# Patient Record
Sex: Male | Born: 1960 | Race: White | Hispanic: No | Marital: Single
Health system: Southern US, Academic
[De-identification: ages and names within clinical notes are randomized; demographics above are authoritative.]

## PROBLEM LIST (undated history)

## (undated) ENCOUNTER — Encounter

## (undated) ENCOUNTER — Encounter
Attending: Student in an Organized Health Care Education/Training Program | Primary: Student in an Organized Health Care Education/Training Program

## (undated) ENCOUNTER — Ambulatory Visit: Payer: MEDICARE

## (undated) ENCOUNTER — Encounter: Attending: Hematology & Oncology | Primary: Hematology & Oncology

## (undated) ENCOUNTER — Ambulatory Visit

## (undated) ENCOUNTER — Encounter: Attending: Psychiatry | Primary: Psychiatry

## (undated) ENCOUNTER — Ambulatory Visit: Attending: Radiation Oncology | Primary: Radiation Oncology

## (undated) ENCOUNTER — Telehealth: Attending: Hematology & Oncology | Primary: Hematology & Oncology

## (undated) ENCOUNTER — Ambulatory Visit: Attending: Clinical | Primary: Clinical

## (undated) ENCOUNTER — Telehealth: Attending: Clinical | Primary: Clinical

## (undated) ENCOUNTER — Non-Acute Institutional Stay: Payer: MEDICARE

## (undated) ENCOUNTER — Ambulatory Visit: Attending: Infectious Disease | Primary: Infectious Disease

## (undated) ENCOUNTER — Telehealth
Attending: Student in an Organized Health Care Education/Training Program | Primary: Student in an Organized Health Care Education/Training Program

## (undated) ENCOUNTER — Encounter: Payer: MEDICARE | Attending: Radiation Oncology | Primary: Radiation Oncology

## (undated) ENCOUNTER — Telehealth

## (undated) ENCOUNTER — Telehealth: Attending: Psychiatry | Primary: Psychiatry

## (undated) DIAGNOSIS — F32A Depression, unspecified: Secondary | ICD-10-CM

## (undated) DIAGNOSIS — Z21 Asymptomatic human immunodeficiency virus [HIV] infection status: Secondary | ICD-10-CM

## (undated) DIAGNOSIS — G629 Polyneuropathy, unspecified: Secondary | ICD-10-CM

## (undated) DIAGNOSIS — B20 Human immunodeficiency virus [HIV] disease: Secondary | ICD-10-CM

## (undated) DIAGNOSIS — K219 Gastro-esophageal reflux disease without esophagitis: Secondary | ICD-10-CM

## (undated) DIAGNOSIS — C8590 Non-Hodgkin lymphoma, unspecified, unspecified site: Secondary | ICD-10-CM

## (undated) DIAGNOSIS — D013 Carcinoma in situ of anus and anal canal: Secondary | ICD-10-CM

## (undated) DIAGNOSIS — F329 Major depressive disorder, single episode, unspecified: Secondary | ICD-10-CM

## (undated) DIAGNOSIS — R911 Solitary pulmonary nodule: Secondary | ICD-10-CM

## (undated) DIAGNOSIS — B59 Pneumocystosis: Secondary | ICD-10-CM

## (undated) DIAGNOSIS — B459 Cryptococcosis, unspecified: Secondary | ICD-10-CM

## (undated) DIAGNOSIS — F419 Anxiety disorder, unspecified: Secondary | ICD-10-CM

## (undated) DIAGNOSIS — E785 Hyperlipidemia, unspecified: Secondary | ICD-10-CM

## (undated) HISTORY — DX: Non-Hodgkin lymphoma, unspecified, unspecified site: C85.90

## (undated) HISTORY — PX: APPENDECTOMY: SHX54

## (undated) HISTORY — DX: Human immunodeficiency virus (HIV) disease: B20

## (undated) HISTORY — PX: LUMBAR DISC SURGERY: SHX700

## (undated) HISTORY — PX: OTHER SURGICAL HISTORY: SHX169

## (undated) HISTORY — DX: Solitary pulmonary nodule: R91.1

---
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
## 2004-06-25 ENCOUNTER — Ambulatory Visit: Payer: Self-pay | Admitting: Pain Medicine

## 2004-07-23 ENCOUNTER — Ambulatory Visit: Payer: Self-pay | Admitting: Pain Medicine

## 2004-08-18 ENCOUNTER — Ambulatory Visit: Payer: Self-pay | Admitting: Pain Medicine

## 2004-08-28 ENCOUNTER — Ambulatory Visit: Payer: Self-pay | Admitting: Otolaryngology

## 2004-08-29 ENCOUNTER — Inpatient Hospital Stay: Payer: Self-pay | Admitting: Otolaryngology

## 2004-09-09 ENCOUNTER — Ambulatory Visit: Payer: Self-pay | Admitting: Otolaryngology

## 2004-09-11 ENCOUNTER — Inpatient Hospital Stay: Payer: Self-pay | Admitting: Otolaryngology

## 2004-10-22 ENCOUNTER — Ambulatory Visit: Payer: Self-pay | Admitting: Internal Medicine

## 2004-11-12 ENCOUNTER — Other Ambulatory Visit: Payer: Self-pay

## 2004-11-12 ENCOUNTER — Inpatient Hospital Stay: Payer: Self-pay | Admitting: Internal Medicine

## 2004-11-18 ENCOUNTER — Ambulatory Visit: Payer: Self-pay | Admitting: Internal Medicine

## 2004-12-12 ENCOUNTER — Ambulatory Visit: Payer: Self-pay | Admitting: Internal Medicine

## 2005-01-11 ENCOUNTER — Ambulatory Visit: Payer: Self-pay | Admitting: Internal Medicine

## 2005-02-11 ENCOUNTER — Inpatient Hospital Stay: Payer: Self-pay | Admitting: Internal Medicine

## 2005-02-16 ENCOUNTER — Ambulatory Visit: Payer: Self-pay | Admitting: Internal Medicine

## 2005-03-13 ENCOUNTER — Ambulatory Visit: Payer: Self-pay | Admitting: Internal Medicine

## 2005-04-13 ENCOUNTER — Ambulatory Visit: Payer: Self-pay | Admitting: Internal Medicine

## 2005-05-14 ENCOUNTER — Ambulatory Visit: Payer: Self-pay | Admitting: Internal Medicine

## 2005-06-25 ENCOUNTER — Ambulatory Visit: Payer: Self-pay | Admitting: Internal Medicine

## 2005-07-14 ENCOUNTER — Ambulatory Visit: Payer: Self-pay | Admitting: Internal Medicine

## 2005-08-13 ENCOUNTER — Ambulatory Visit: Payer: Self-pay | Admitting: Internal Medicine

## 2005-09-15 ENCOUNTER — Ambulatory Visit: Payer: Self-pay | Admitting: Specialist

## 2005-09-24 ENCOUNTER — Ambulatory Visit: Payer: Self-pay | Admitting: Otolaryngology

## 2005-10-01 ENCOUNTER — Ambulatory Visit: Payer: Self-pay | Admitting: Internal Medicine

## 2005-10-05 ENCOUNTER — Ambulatory Visit: Payer: Self-pay | Admitting: Unknown Physician Specialty

## 2005-10-14 ENCOUNTER — Ambulatory Visit: Payer: Self-pay | Admitting: Internal Medicine

## 2005-11-11 ENCOUNTER — Ambulatory Visit: Payer: Self-pay | Admitting: Internal Medicine

## 2005-12-12 ENCOUNTER — Ambulatory Visit: Payer: Self-pay | Admitting: Internal Medicine

## 2006-01-11 ENCOUNTER — Ambulatory Visit: Payer: Self-pay | Admitting: Internal Medicine

## 2006-03-01 ENCOUNTER — Ambulatory Visit: Payer: Self-pay | Admitting: Internal Medicine

## 2006-03-09 ENCOUNTER — Ambulatory Visit: Payer: Self-pay | Admitting: Specialist

## 2006-03-13 ENCOUNTER — Ambulatory Visit: Payer: Self-pay | Admitting: Internal Medicine

## 2006-04-13 ENCOUNTER — Ambulatory Visit: Payer: Self-pay | Admitting: Internal Medicine

## 2006-05-14 ENCOUNTER — Ambulatory Visit: Payer: Self-pay | Admitting: Internal Medicine

## 2006-06-13 ENCOUNTER — Inpatient Hospital Stay: Payer: Self-pay | Admitting: Specialist

## 2006-06-13 ENCOUNTER — Other Ambulatory Visit: Payer: Self-pay

## 2006-06-14 ENCOUNTER — Other Ambulatory Visit: Payer: Self-pay

## 2006-08-01 ENCOUNTER — Ambulatory Visit: Payer: Self-pay | Admitting: Internal Medicine

## 2006-08-13 ENCOUNTER — Ambulatory Visit: Payer: Self-pay | Admitting: Internal Medicine

## 2006-10-13 ENCOUNTER — Ambulatory Visit: Payer: Self-pay | Admitting: Specialist

## 2006-10-13 ENCOUNTER — Ambulatory Visit: Payer: Self-pay | Admitting: Internal Medicine

## 2006-10-27 ENCOUNTER — Ambulatory Visit: Payer: Self-pay | Admitting: Internal Medicine

## 2006-11-12 ENCOUNTER — Ambulatory Visit: Payer: Self-pay | Admitting: Internal Medicine

## 2006-12-13 ENCOUNTER — Ambulatory Visit: Payer: Self-pay | Admitting: Internal Medicine

## 2007-01-23 ENCOUNTER — Ambulatory Visit: Payer: Self-pay | Admitting: Internal Medicine

## 2007-01-25 ENCOUNTER — Ambulatory Visit: Payer: Self-pay | Admitting: Internal Medicine

## 2007-02-12 ENCOUNTER — Ambulatory Visit: Payer: Self-pay | Admitting: Internal Medicine

## 2007-03-14 ENCOUNTER — Ambulatory Visit: Payer: Self-pay | Admitting: Internal Medicine

## 2007-04-14 ENCOUNTER — Ambulatory Visit: Payer: Self-pay | Admitting: Internal Medicine

## 2007-04-26 ENCOUNTER — Ambulatory Visit: Payer: Self-pay | Admitting: Internal Medicine

## 2007-05-15 ENCOUNTER — Ambulatory Visit: Payer: Self-pay | Admitting: Internal Medicine

## 2007-06-14 ENCOUNTER — Ambulatory Visit: Payer: Self-pay | Admitting: Internal Medicine

## 2007-07-15 ENCOUNTER — Ambulatory Visit: Payer: Self-pay | Admitting: Internal Medicine

## 2007-08-06 ENCOUNTER — Emergency Department: Payer: Self-pay | Admitting: Emergency Medicine

## 2007-08-14 ENCOUNTER — Ambulatory Visit: Payer: Self-pay | Admitting: Internal Medicine

## 2007-09-14 ENCOUNTER — Ambulatory Visit: Payer: Self-pay | Admitting: Internal Medicine

## 2007-10-03 ENCOUNTER — Ambulatory Visit: Payer: Self-pay | Admitting: Internal Medicine

## 2007-10-15 ENCOUNTER — Ambulatory Visit: Payer: Self-pay | Admitting: Internal Medicine

## 2007-11-12 ENCOUNTER — Ambulatory Visit: Payer: Self-pay | Admitting: Internal Medicine

## 2007-12-13 ENCOUNTER — Ambulatory Visit: Payer: Self-pay | Admitting: Internal Medicine

## 2008-01-12 ENCOUNTER — Ambulatory Visit: Payer: Self-pay | Admitting: Internal Medicine

## 2008-02-12 ENCOUNTER — Ambulatory Visit: Payer: Self-pay | Admitting: Internal Medicine

## 2008-02-21 ENCOUNTER — Ambulatory Visit: Payer: Self-pay | Admitting: Internal Medicine

## 2008-03-13 ENCOUNTER — Ambulatory Visit: Payer: Self-pay | Admitting: Internal Medicine

## 2008-05-14 ENCOUNTER — Ambulatory Visit: Payer: Self-pay | Admitting: Internal Medicine

## 2008-05-21 ENCOUNTER — Ambulatory Visit: Payer: Self-pay | Admitting: Internal Medicine

## 2008-06-11 ENCOUNTER — Ambulatory Visit: Payer: Self-pay | Admitting: Specialist

## 2008-06-13 ENCOUNTER — Ambulatory Visit: Payer: Self-pay | Admitting: Internal Medicine

## 2008-07-14 ENCOUNTER — Ambulatory Visit: Payer: Self-pay | Admitting: Internal Medicine

## 2008-08-14 ENCOUNTER — Ambulatory Visit: Payer: Self-pay | Admitting: Internal Medicine

## 2008-09-13 ENCOUNTER — Ambulatory Visit: Payer: Self-pay | Admitting: Internal Medicine

## 2008-10-14 ENCOUNTER — Ambulatory Visit: Payer: Self-pay | Admitting: Internal Medicine

## 2008-10-27 IMAGING — CR DG CHEST 2V
1 series · 2 of 2 positions shown · non-contrast
Comparison: none

REASON FOR EXAM: COUGH
COMMENTS:

[Series 1: view not recorded · 0.17mm/px · 2 of 2 slices shown]
[im 1/2]
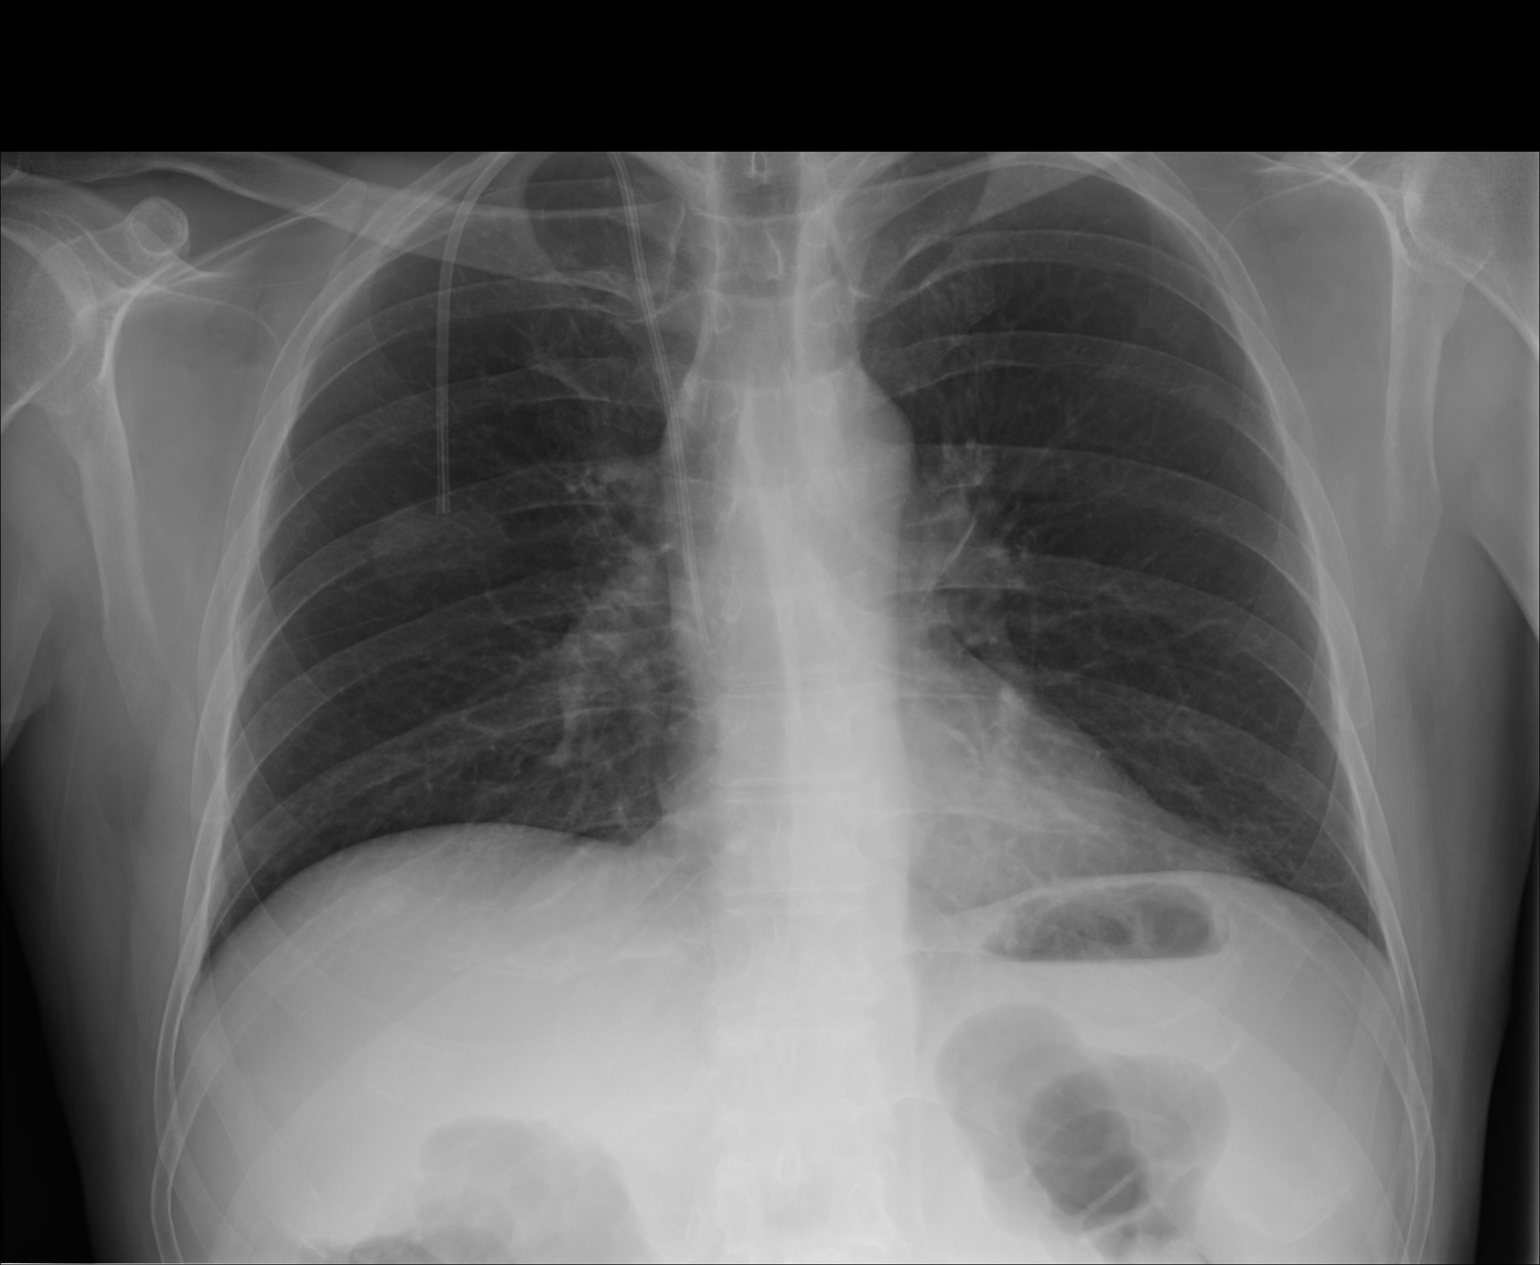
[im 2/2]
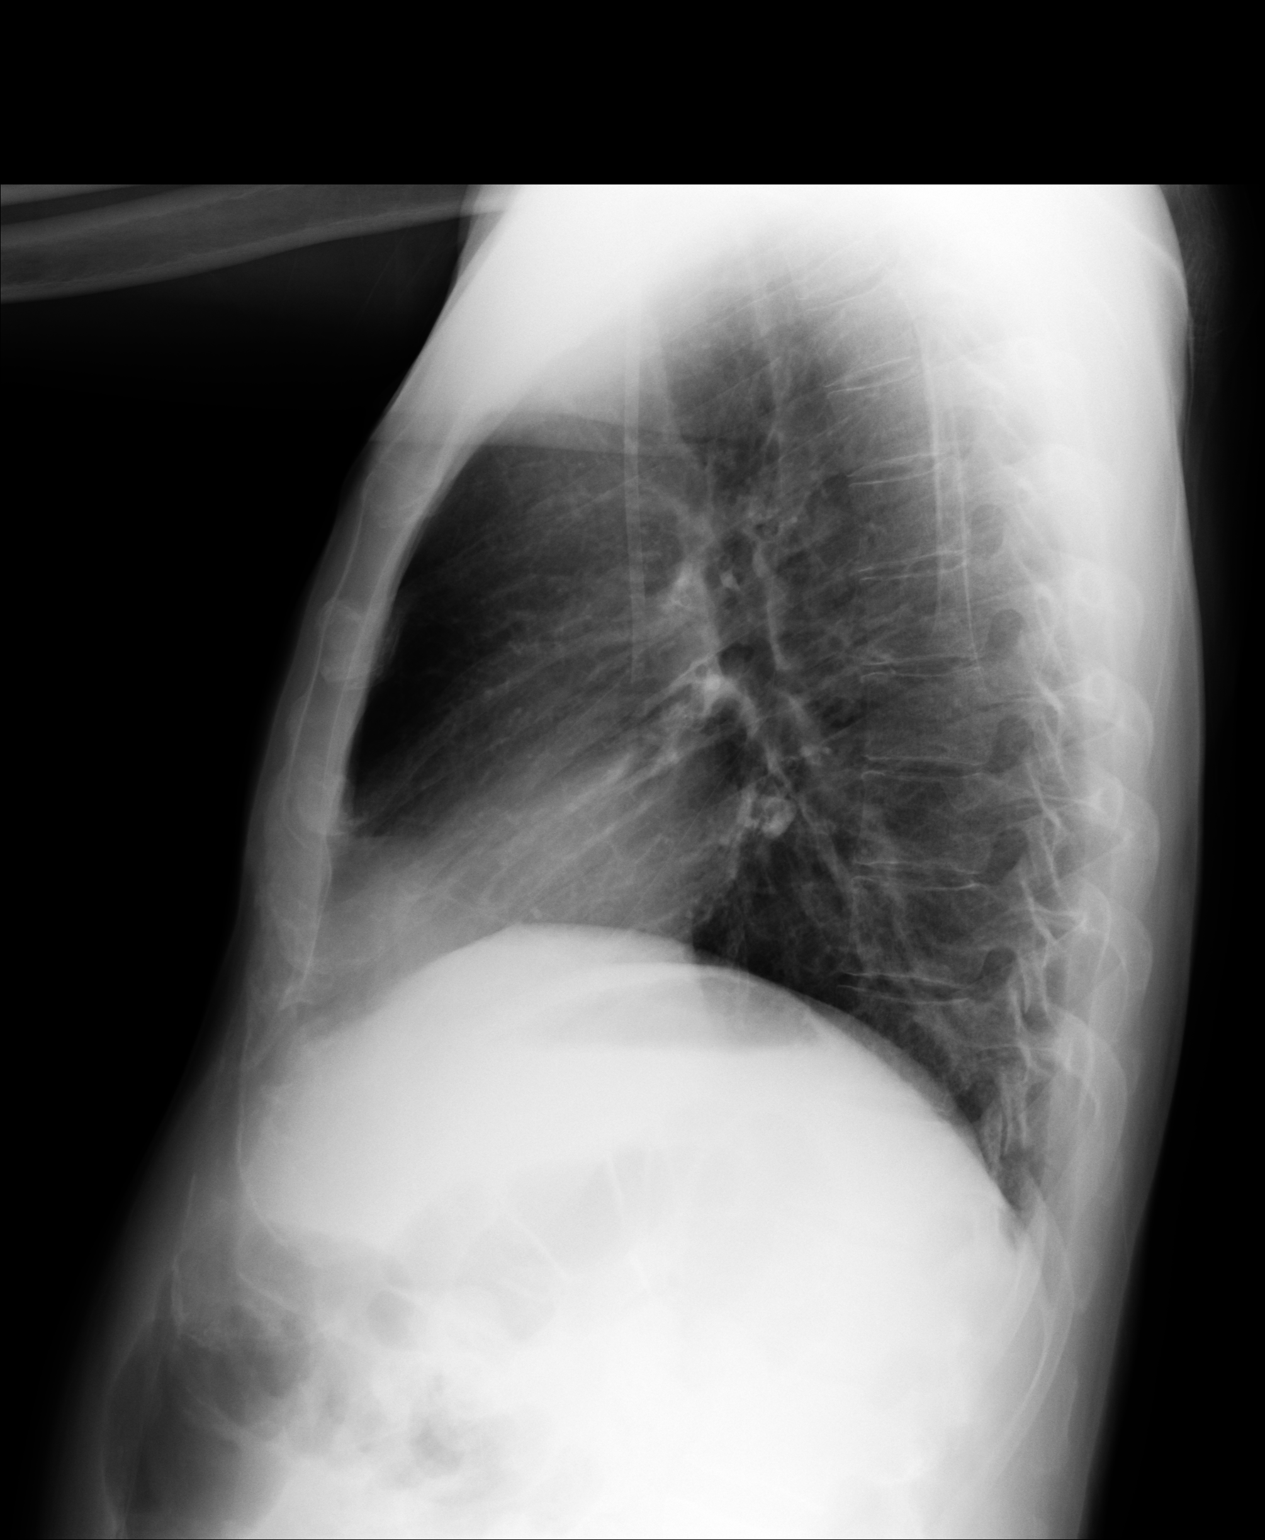

[2 of 2 positions shown; findings below may reference images not displayed]

PROCEDURE:     DXR - DXR CHEST PA (OR AP) AND LATERAL  - June 11, 2008 [DATE]

RESULT:     Comparison is made to the exam of 06/13/2006. A RIGHT-sided
Port-A-Cath device remains present. The heart is normal in size. The lungs
are clear. The lung markings are slightly coarse. There is no effusion or
definite mass.
IMPRESSION: No acute cardiopulmonary disease present. Stable appearance. Port-A-Cath
device present with the tip of the catheter in the superior vena cava near
the RIGHT atrium region.

## 2008-10-31 ENCOUNTER — Emergency Department: Payer: Self-pay | Admitting: Emergency Medicine

## 2008-11-07 ENCOUNTER — Ambulatory Visit: Payer: Self-pay | Admitting: Specialist

## 2008-11-11 ENCOUNTER — Ambulatory Visit: Payer: Self-pay | Admitting: Internal Medicine

## 2008-11-19 IMAGING — CT CT NECK-CHEST-ABD-PELV W/ CM
1 of 2 series · 9 of 14 positions shown, 11 images · non-contrast
Comparison: none

REASON FOR EXAM: Lymphoma follow-up
COMMENTS:

[Series 2: soft tissue · axial · 0.76mm/px · z∈[+18,+702]mm · 9 of 173 slices shown, 11 images]
[im 18/173  soft-tissue]
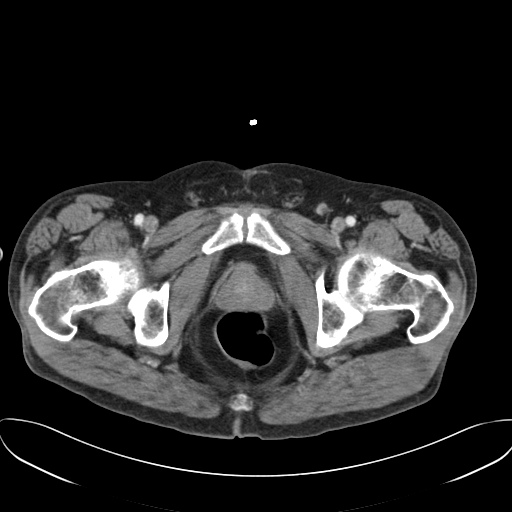
[im 18/173  bone]
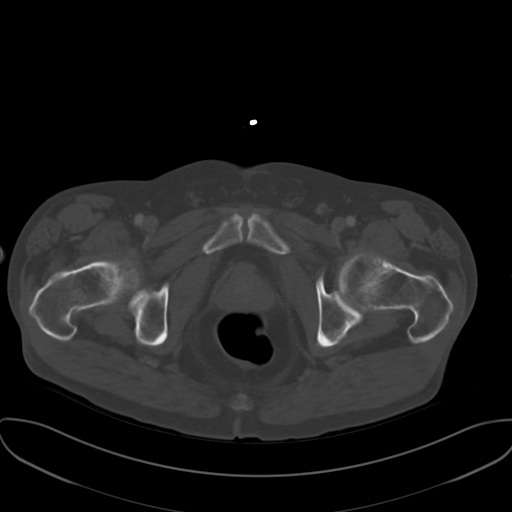
[im 35/173  soft-tissue]
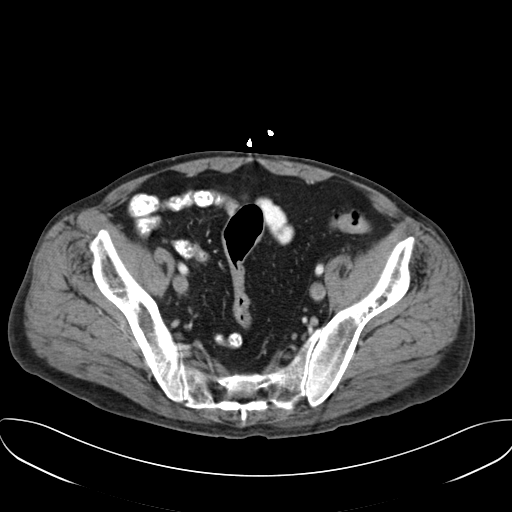
[im 52/173  soft-tissue]
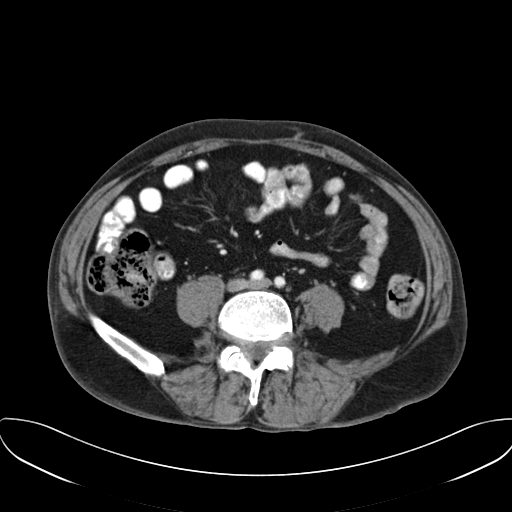
[im 69/173  soft-tissue]
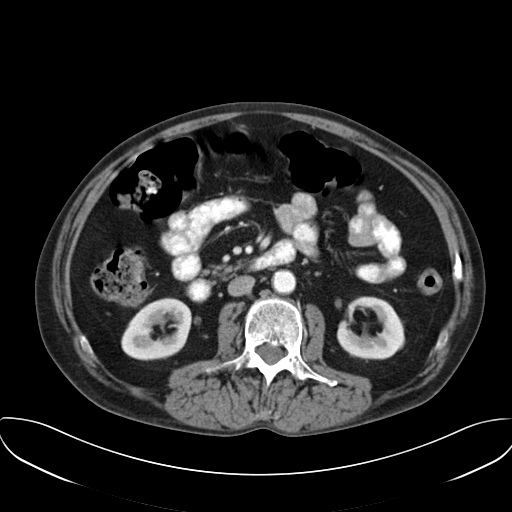
[im 87/173  soft-tissue]
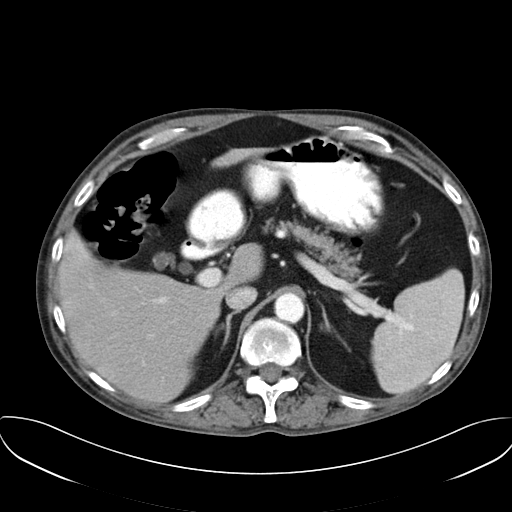
[im 104/173  soft-tissue]
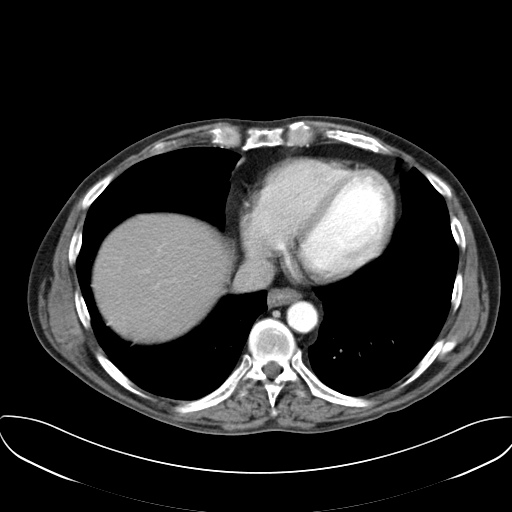
[im 121/173  soft-tissue]
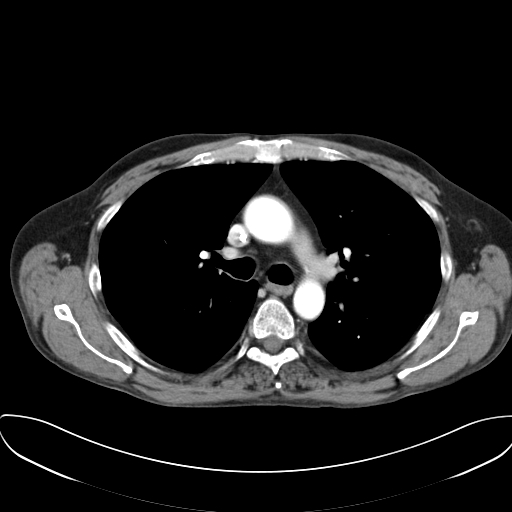
[im 138/173  soft-tissue]
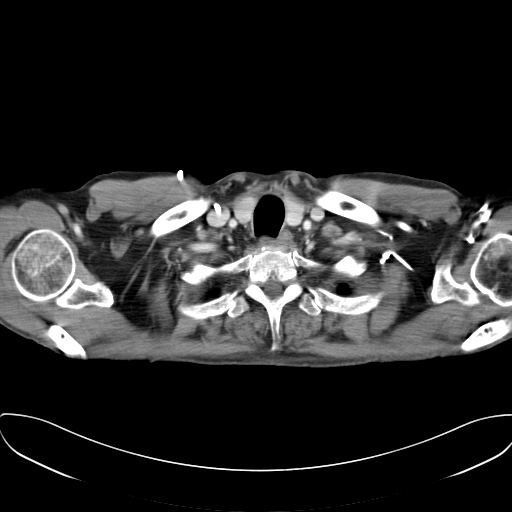
[im 155/173  soft-tissue]
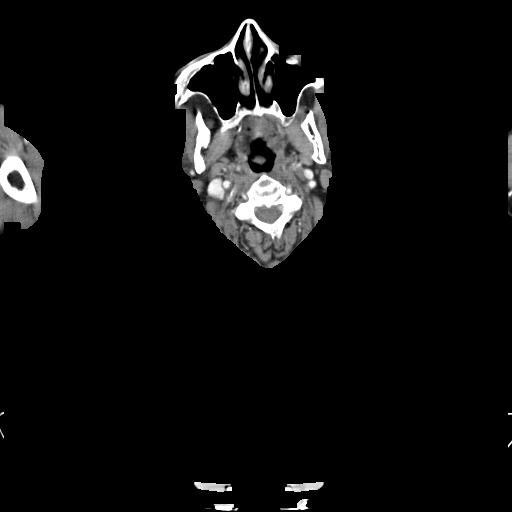
[im 155/173  bone]
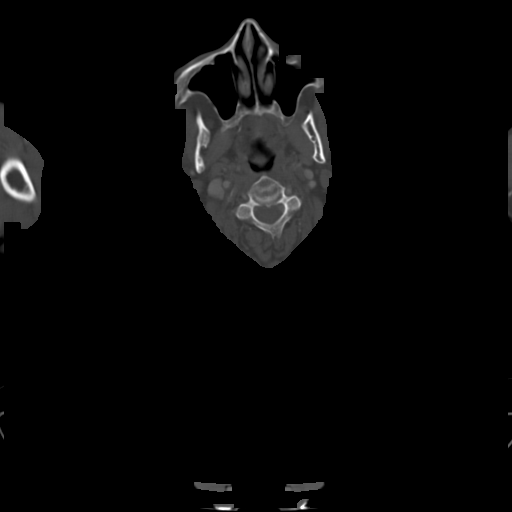

[9 of 14 positions shown; findings below may reference images not displayed]

PROCEDURE:     CT  - CT NECK CHEST ABD/PEL W  - July 04, 2008  [DATE]

RESULT:     CT of the neck, chest abdomen and pelvis is performed utilizing
oral contrast and approximately 85 ml of Isovue-XJB iodinated intravenous
contrast.

Comparison is made to the most recent CT of similar regions performed on
03/09/2006.
FINDINGS: Cervical lymph nodes seen posterior to the jugular vein on the
RIGHT are smaller than on the previous scan and similar in appearance to
that on the previous PET/CT of 10/03/2007. At that point, there was
continued abnormal localization within these nodes. PET follow-up is
recommended. No enlarging cervical lymph nodes are present. No neck mass is
evident. There is an air/fluid level in the RIGHT sphenoid sinus which is
nonspecific but could be associated with sphenoid sinusitis. There is no
evidence of abnormal nodal tissue in the mediastinum with no enlarged nodes
present. No hilar mass or adenopathy is demonstrated. The study demonstrates
no evidence of axillary mass or adenopathy. Images through the lungs
demonstrate no focal pulmonary parenchymal mass. There is no infiltrate,
edema or effusion.

Images through the abdomen and pelvis demonstrate an unremarkable appearance
of the spleen, liver, gallbladder and pancreas with the exception of mild
prominence of the intrahepatic biliary system. There is a tiny area of low
attenuation in the liver measuring 7.0 mm on image #82 adjacent to the porta
hepatis which is unchanged since the study two years ago. This is
nonspecific but could represent a small cyst or hemangioma. Correlation with
LFT's is recommended given the mild intrahepatic biliary ductal dilation.
The common bile duct does not appear to be distended. There is no evidence
of adenopathy within the porta hepatis or retroperitoneum. No mesenteric or
pelvic mass or adenopathy is present. No inguinal mass or adenopathy is
evident. The urinary bladder is unremarkable. The bowel appears to be within
normal limits. No inflammatory changes are present. There is no free air or
free fluid.
IMPRESSION: 1.  Stable lymph nodes in the RIGHT neck posterior to the jugular. These are
nonenlarged and were positive on the previous PET. PET follow-up could be
considered.
2.  Mild intrahepatic biliary ductal dilation. Correlation with LFT's is
recommended. The common bile duct does not appear to be abnormally distended
by CT.
3.  No other significant abnormality.

## 2008-12-20 ENCOUNTER — Ambulatory Visit: Payer: Self-pay | Admitting: Specialist

## 2008-12-25 ENCOUNTER — Ambulatory Visit: Payer: Self-pay | Admitting: Internal Medicine

## 2009-01-11 ENCOUNTER — Ambulatory Visit: Payer: Self-pay | Admitting: Internal Medicine

## 2009-02-11 ENCOUNTER — Ambulatory Visit: Payer: Self-pay | Admitting: Internal Medicine

## 2009-04-22 ENCOUNTER — Ambulatory Visit: Payer: Self-pay | Admitting: Internal Medicine

## 2009-05-14 ENCOUNTER — Ambulatory Visit: Payer: Self-pay | Admitting: Internal Medicine

## 2009-06-13 ENCOUNTER — Ambulatory Visit: Payer: Self-pay | Admitting: Internal Medicine

## 2009-10-14 ENCOUNTER — Ambulatory Visit: Payer: Self-pay | Admitting: Internal Medicine

## 2009-10-16 ENCOUNTER — Ambulatory Visit: Payer: Self-pay | Admitting: Internal Medicine

## 2009-11-11 ENCOUNTER — Ambulatory Visit: Payer: Self-pay | Admitting: Internal Medicine

## 2009-12-12 ENCOUNTER — Ambulatory Visit: Payer: Self-pay | Admitting: Internal Medicine

## 2009-12-19 ENCOUNTER — Inpatient Hospital Stay: Payer: Self-pay | Admitting: Specialist

## 2010-02-23 ENCOUNTER — Ambulatory Visit: Payer: Self-pay | Admitting: Internal Medicine

## 2010-03-13 ENCOUNTER — Ambulatory Visit: Payer: Self-pay | Admitting: Internal Medicine

## 2010-03-30 ENCOUNTER — Inpatient Hospital Stay: Payer: Self-pay | Admitting: Specialist

## 2010-04-13 ENCOUNTER — Ambulatory Visit: Payer: Self-pay | Admitting: Internal Medicine

## 2010-07-08 ENCOUNTER — Ambulatory Visit: Payer: Self-pay | Admitting: Internal Medicine

## 2010-07-14 ENCOUNTER — Ambulatory Visit: Payer: Self-pay | Admitting: Internal Medicine

## 2010-07-23 ENCOUNTER — Ambulatory Visit: Payer: Self-pay | Admitting: Specialist

## 2010-08-13 ENCOUNTER — Ambulatory Visit: Payer: Self-pay | Admitting: Internal Medicine

## 2010-11-11 ENCOUNTER — Ambulatory Visit: Payer: Self-pay | Admitting: Internal Medicine

## 2010-11-12 ENCOUNTER — Ambulatory Visit: Payer: Self-pay | Admitting: Internal Medicine

## 2011-01-12 DIAGNOSIS — R911 Solitary pulmonary nodule: Secondary | ICD-10-CM

## 2011-01-12 HISTORY — DX: Solitary pulmonary nodule: R91.1

## 2011-02-10 ENCOUNTER — Ambulatory Visit: Payer: Self-pay | Admitting: Internal Medicine

## 2011-02-12 ENCOUNTER — Ambulatory Visit: Payer: Self-pay | Admitting: Internal Medicine

## 2011-02-22 ENCOUNTER — Ambulatory Visit: Payer: Self-pay | Admitting: Internal Medicine

## 2011-03-03 ENCOUNTER — Ambulatory Visit: Payer: Self-pay | Admitting: Internal Medicine

## 2011-03-14 ENCOUNTER — Ambulatory Visit: Payer: Self-pay | Admitting: Internal Medicine

## 2011-04-14 ENCOUNTER — Ambulatory Visit: Payer: Self-pay | Admitting: Internal Medicine

## 2011-05-12 ENCOUNTER — Ambulatory Visit: Payer: Self-pay | Admitting: Specialist

## 2011-06-18 ENCOUNTER — Ambulatory Visit: Payer: Self-pay | Admitting: Internal Medicine

## 2011-07-15 ENCOUNTER — Ambulatory Visit: Payer: Self-pay | Admitting: Internal Medicine

## 2011-07-22 ENCOUNTER — Other Ambulatory Visit: Payer: Self-pay | Admitting: Podiatry

## 2011-08-14 ENCOUNTER — Ambulatory Visit: Payer: Self-pay | Admitting: Internal Medicine

## 2012-02-16 ENCOUNTER — Ambulatory Visit: Payer: Self-pay | Admitting: Specialist

## 2012-03-07 ENCOUNTER — Ambulatory Visit: Payer: Self-pay | Admitting: Internal Medicine

## 2012-03-07 LAB — CBC CANCER CENTER
Lymphocyte %: 30.7 %
MCH: 40 pg — ABNORMAL HIGH (ref 26.0–34.0)
MCHC: 32.6 g/dL (ref 32.0–36.0)
Monocyte #: 0.4 x10 3/mm (ref 0.2–1.0)
Neutrophil #: 2.5 x10 3/mm (ref 1.4–6.5)
Neutrophil %: 58.7 %
Platelet: 160 x10 3/mm (ref 150–440)
RBC: 2.82 10*6/uL — ABNORMAL LOW (ref 4.40–5.90)
RDW: 13.4 % (ref 11.5–14.5)
WBC: 4.2 x10 3/mm (ref 3.8–10.6)

## 2012-03-07 LAB — HEPATIC FUNCTION PANEL A (ARMC)
Bilirubin, Direct: 0.2 mg/dL (ref 0.00–0.20)
Bilirubin,Total: 0.9 mg/dL (ref 0.2–1.0)
SGPT (ALT): 27 U/L
Total Protein: 6.7 g/dL (ref 6.4–8.2)

## 2012-03-07 LAB — LACTATE DEHYDROGENASE: LDH: 253 U/L — ABNORMAL HIGH (ref 87–241)

## 2012-03-07 LAB — CREATININE, SERUM: Creatinine: 0.99 mg/dL (ref 0.60–1.30)

## 2012-03-13 ENCOUNTER — Ambulatory Visit: Payer: Self-pay | Admitting: Internal Medicine

## 2012-03-17 ENCOUNTER — Ambulatory Visit: Payer: Self-pay | Admitting: Internal Medicine

## 2012-05-12 ENCOUNTER — Ambulatory Visit: Payer: Self-pay | Admitting: Internal Medicine

## 2012-05-14 ENCOUNTER — Ambulatory Visit: Payer: Self-pay | Admitting: Internal Medicine

## 2012-09-22 ENCOUNTER — Ambulatory Visit: Payer: Self-pay | Admitting: Internal Medicine

## 2012-09-22 LAB — HEPATIC FUNCTION PANEL A (ARMC)
Albumin: 4 g/dL (ref 3.4–5.0)
SGOT(AST): 20 U/L (ref 15–37)
SGPT (ALT): 17 U/L (ref 12–78)
Total Protein: 7.3 g/dL (ref 6.4–8.2)

## 2012-09-22 LAB — LACTATE DEHYDROGENASE: LDH: 308 U/L — ABNORMAL HIGH (ref 85–241)

## 2012-09-22 LAB — CBC CANCER CENTER
Basophil #: 0 x10 3/mm (ref 0.0–0.1)
Eosinophil #: 0.1 x10 3/mm (ref 0.0–0.7)
Eosinophil %: 1.1 %
HCT: 32.4 % — ABNORMAL LOW (ref 40.0–52.0)
HGB: 10.7 g/dL — ABNORMAL LOW (ref 13.0–18.0)
Lymphocyte #: 1.5 x10 3/mm (ref 1.0–3.6)
MCH: 38.6 pg — ABNORMAL HIGH (ref 26.0–34.0)
Neutrophil #: 3.4 x10 3/mm (ref 1.4–6.5)
Neutrophil %: 61.4 %
RDW: 16.7 % — ABNORMAL HIGH (ref 11.5–14.5)
WBC: 5.5 x10 3/mm (ref 3.8–10.6)

## 2012-09-22 LAB — CREATININE, SERUM: EGFR (Non-African Amer.): >60

## 2012-10-13 ENCOUNTER — Ambulatory Visit: Payer: Self-pay | Admitting: Vascular Surgery

## 2012-10-14 ENCOUNTER — Ambulatory Visit: Payer: Self-pay | Admitting: Internal Medicine

## 2013-03-19 ENCOUNTER — Ambulatory Visit: Payer: Self-pay | Admitting: Internal Medicine

## 2013-03-20 LAB — CBC CANCER CENTER
Basophil #: 0 x10 3/mm (ref 0.0–0.1)
Basophil %: 0.6 %
Eosinophil #: 0.1 x10 3/mm (ref 0.0–0.7)
HCT: 35.8 % — ABNORMAL LOW (ref 40.0–52.0)
Lymphocyte %: 40.1 %
MCH: 38 pg — ABNORMAL HIGH (ref 26.0–34.0)
MCHC: 35.2 g/dL (ref 32.0–36.0)
Monocyte #: 0.3 x10 3/mm (ref 0.2–1.0)
Monocyte %: 7.4 %
Neutrophil #: 2.2 x10 3/mm (ref 1.4–6.5)
Neutrophil %: 50.8 %
Platelet: 151 x10 3/mm (ref 150–440)

## 2013-03-20 LAB — CREATININE, SERUM: Creatinine: 1.21 mg/dL (ref 0.60–1.30)

## 2013-03-20 LAB — HEPATIC FUNCTION PANEL A (ARMC)
Bilirubin,Total: 0.5 mg/dL (ref 0.2–1.0)
SGOT(AST): 24 U/L (ref 15–37)

## 2013-03-20 LAB — LACTATE DEHYDROGENASE: LDH: 164 U/L (ref 85–241)

## 2013-04-13 ENCOUNTER — Ambulatory Visit: Payer: Self-pay | Admitting: Internal Medicine

## 2013-09-21 ENCOUNTER — Ambulatory Visit: Payer: Self-pay | Admitting: Internal Medicine

## 2013-09-21 LAB — HEPATIC FUNCTION PANEL A (ARMC)
ALBUMIN: 4.2 g/dL (ref 3.4–5.0)
ALK PHOS: 71 U/L
ALT: 27 U/L (ref 12–78)
AST: 22 U/L (ref 15–37)
BILIRUBIN DIRECT: 0.2 mg/dL (ref 0.00–0.20)
Bilirubin,Total: 0.8 mg/dL (ref 0.2–1.0)
Total Protein: 6.8 g/dL (ref 6.4–8.2)

## 2013-09-21 LAB — CBC CANCER CENTER
Basophil #: 0 x10 3/mm (ref 0.0–0.1)
Basophil %: 1.2 %
Eosinophil #: 0.1 x10 3/mm (ref 0.0–0.7)
Eosinophil %: 1.8 %
HCT: 38.2 % — AB (ref 40.0–52.0)
HGB: 12.7 g/dL — ABNORMAL LOW (ref 13.0–18.0)
Lymphocyte #: 2.1 x10 3/mm (ref 1.0–3.6)
Lymphocyte %: 51.8 %
MCH: 37.5 pg — AB (ref 26.0–34.0)
MCHC: 33.3 g/dL (ref 32.0–36.0)
MCV: 113 fL — ABNORMAL HIGH (ref 80–100)
Monocyte #: 0.3 x10 3/mm (ref 0.2–1.0)
Monocyte %: 8.6 %
NEUTROS ABS: 1.5 x10 3/mm (ref 1.4–6.5)
NEUTROS PCT: 36.6 %
Platelet: 170 x10 3/mm (ref 150–440)
RBC: 3.39 10*6/uL — AB (ref 4.40–5.90)
RDW: 13.4 % (ref 11.5–14.5)
WBC: 4 x10 3/mm (ref 3.8–10.6)

## 2013-09-21 LAB — CREATININE, SERUM
Creatinine: 1.37 mg/dL — ABNORMAL HIGH (ref 0.60–1.30)
EGFR (African American): >60
EGFR (Non-African Amer.): 59 — ABNORMAL LOW

## 2013-09-21 LAB — LACTATE DEHYDROGENASE: LDH: 161 U/L (ref 85–241)

## 2013-10-14 ENCOUNTER — Ambulatory Visit: Payer: Self-pay | Admitting: Internal Medicine

## 2014-01-03 ENCOUNTER — Ambulatory Visit: Payer: Self-pay

## 2014-01-07 ENCOUNTER — Ambulatory Visit: Payer: Self-pay | Admitting: Internal Medicine

## 2014-01-07 LAB — APTT: Activated PTT: 29.2 secs (ref 23.6–35.9)

## 2014-01-07 LAB — COMPREHENSIVE METABOLIC PANEL
Albumin: 3.8 g/dL (ref 3.4–5.0)
Alkaline Phosphatase: 97 U/L
Anion Gap: 7 (ref 7–16)
BUN: 15 mg/dL (ref 7–18)
Bilirubin,Total: 0.4 mg/dL (ref 0.2–1.0)
CO2: 30 mmol/L (ref 21–32)
CREATININE: 1.22 mg/dL (ref 0.60–1.30)
Calcium, Total: 9.3 mg/dL (ref 8.5–10.1)
Chloride: 102 mmol/L (ref 98–107)
EGFR (African American): >60
Glucose: 113 mg/dL — ABNORMAL HIGH (ref 65–99)
OSMOLALITY: 279 (ref 275–301)
Potassium: 4.4 mmol/L (ref 3.5–5.1)
SGOT(AST): 25 U/L (ref 15–37)
SGPT (ALT): 28 U/L (ref 12–78)
Sodium: 139 mmol/L (ref 136–145)
Total Protein: 6.8 g/dL (ref 6.4–8.2)

## 2014-01-07 LAB — CBC CANCER CENTER
Basophil #: 0 x10 3/mm (ref 0.0–0.1)
Basophil %: 0.8 %
EOS ABS: 0 x10 3/mm (ref 0.0–0.7)
Eosinophil %: 1.4 %
HCT: 38.6 % — AB (ref 40.0–52.0)
HGB: 12.8 g/dL — ABNORMAL LOW (ref 13.0–18.0)
LYMPHS ABS: 0.9 x10 3/mm — AB (ref 1.0–3.6)
Lymphocyte %: 32.4 %
MCH: 37.6 pg — ABNORMAL HIGH (ref 26.0–34.0)
MCHC: 33.3 g/dL (ref 32.0–36.0)
MCV: 113 fL — ABNORMAL HIGH (ref 80–100)
MONOS PCT: 7.2 %
Monocyte #: 0.2 x10 3/mm (ref 0.2–1.0)
Neutrophil #: 1.6 x10 3/mm (ref 1.4–6.5)
Neutrophil %: 58.2 %
Platelet: 140 x10 3/mm — ABNORMAL LOW (ref 150–440)
RBC: 3.42 10*6/uL — ABNORMAL LOW (ref 4.40–5.90)
RDW: 14.7 % — ABNORMAL HIGH (ref 11.5–14.5)
WBC: 2.8 x10 3/mm — AB (ref 3.8–10.6)

## 2014-01-07 LAB — PROTIME-INR
INR: 0.9
PROTHROMBIN TIME: 11.8 s (ref 11.5–14.7)

## 2014-01-11 ENCOUNTER — Ambulatory Visit: Payer: Self-pay | Admitting: Internal Medicine

## 2014-01-14 ENCOUNTER — Ambulatory Visit: Payer: Self-pay | Admitting: Internal Medicine

## 2014-07-09 ENCOUNTER — Ambulatory Visit: Payer: Self-pay | Admitting: Internal Medicine

## 2014-07-09 LAB — CREATININE, SERUM
Creatinine: 1.03 mg/dL (ref 0.60–1.30)
EGFR (Non-African Amer.): >60

## 2014-07-09 LAB — CBC CANCER CENTER
Basophil #: 0 x10 3/mm (ref 0.0–0.1)
Basophil %: 0.8 %
Eosinophil #: 0.1 x10 3/mm (ref 0.0–0.7)
Eosinophil %: 1.4 %
HCT: 39 % — ABNORMAL LOW (ref 40.0–52.0)
HGB: 12.9 g/dL — ABNORMAL LOW (ref 13.0–18.0)
Lymphocyte #: 1.6 x10 3/mm (ref 1.0–3.6)
Lymphocyte %: 38.1 %
MCH: 37 pg — ABNORMAL HIGH (ref 26.0–34.0)
MCHC: 33.2 g/dL (ref 32.0–36.0)
MCV: 111 fL — ABNORMAL HIGH (ref 80–100)
Monocyte #: 0.3 x10 3/mm (ref 0.2–1.0)
Monocyte %: 7.1 %
Neutrophil #: 2.2 x10 3/mm (ref 1.4–6.5)
Neutrophil %: 52.6 %
PLATELETS: 238 x10 3/mm (ref 150–440)
RBC: 3.5 10*6/uL — AB (ref 4.40–5.90)
RDW: 14.8 % — ABNORMAL HIGH (ref 11.5–14.5)
WBC: 4.2 x10 3/mm (ref 3.8–10.6)

## 2014-07-09 LAB — LACTATE DEHYDROGENASE: LDH: 203 U/L (ref 85–241)

## 2014-07-09 LAB — HEPATIC FUNCTION PANEL A (ARMC)
Albumin: 3.8 g/dL (ref 3.4–5.0)
Alkaline Phosphatase: 78 U/L
Bilirubin, Direct: 0.1 mg/dL (ref 0.0–0.2)
Bilirubin,Total: 0.4 mg/dL (ref 0.2–1.0)
SGOT(AST): 45 U/L — ABNORMAL HIGH (ref 15–37)
SGPT (ALT): 25 U/L
Total Protein: 7.5 g/dL (ref 6.4–8.2)

## 2014-07-14 ENCOUNTER — Ambulatory Visit: Payer: Self-pay | Admitting: Internal Medicine

## 2015-01-03 NOTE — Op Note (Signed)
PATIENT NAME:  Steven Compton, Steven Compton MR#:  [id number] DATE OF BIRTH:  08-13-1961  DATE OF PROCEDURE:  10/13/2012  PREOPERATIVE DIAGNOSIS:  Lymphoma status post chemotherapy and Port-A-Cath placement with Port-A-Cath no longer being used.  POSTOPERATIVE DIAGNOSIS:  Lymphoma status post chemotherapy and Port-A-Cath placement with Port-A-Cath no longer being used.   PROCEDURE:  Removal of a Port-A-Cath.   SURGEON:  Kinaya Hilliker  ANESTHESIA: Local with moderate conscious sedation.   BLOOD LOSS: Minimal.  INDICATION FOR PROCEDURE:  A 54 year old white male with a longstanding Port-A-Cath that has not been necessary for several years. He desires to have this removed.   DESCRIPTION OF PROCEDURE: The patient is brought to the vascular radiology suite. Right neck and chest sterilely prepped and draped. A sterile surgical field was created. The previous incision was anesthetized and the port was dissected out. It was dissected free from the surrounding scar tissue and the catheter was then gently removed in its entirety with gentle traction and the port was removed in its entirety without difficulty. Figure-of-eight Vicryl sutures were used to close the catheter site and then the incision was closed with a running 3-0 Vicryl and 4-0 Monocryl. Dermabond was placed as dressing. The patient tolerated the procedure well and was taken to the recovery room in stable condition.    ____________________________ Algernon Huxley, MD jsd:ce D: 10/13/2012 09:20:04 ET T: 10/13/2012 12:31:26 ET JOB#: [id number] Algernon Huxley MD ELECTRONICALLY SIGNED 10/14/2012 15:40

## 2015-01-30 ENCOUNTER — Other Ambulatory Visit: Payer: Self-pay | Admitting: *Deleted

## 2015-01-30 NOTE — Telephone Encounter (Signed)
Both last filled on 01/01/15 for same quantity Wants to pick up on 5//20/16

## 2015-01-31 MED ORDER — OXYCODONE HCL 10 MG PO TABS: 10.0000 mg | ORAL_TABLET | Freq: Four times a day (QID) | ORAL | Status: DC | PRN

## 2015-01-31 MED ORDER — MORPHINE SULFATE ER 15 MG PO TBCR: EXTENDED_RELEASE_TABLET | ORAL | Status: DC

## 2015-02-13 ENCOUNTER — Other Ambulatory Visit: Payer: Self-pay

## 2015-02-13 DIAGNOSIS — C8330 Diffuse large B-cell lymphoma, unspecified site: Secondary | ICD-10-CM

## 2015-02-14 ENCOUNTER — Encounter (INDEPENDENT_AMBULATORY_CARE_PROVIDER_SITE_OTHER): Payer: Self-pay

## 2015-02-14 ENCOUNTER — Inpatient Hospital Stay (HOSPITAL_BASED_OUTPATIENT_CLINIC_OR_DEPARTMENT_OTHER): Payer: Medicare Other | Admitting: Internal Medicine

## 2015-02-14 ENCOUNTER — Inpatient Hospital Stay: Payer: Medicare Other | Attending: Internal Medicine

## 2015-02-14 VITALS — BP 112/80 | HR 68 | Temp 97.6°F | Resp 18 | Ht 73.0 in | Wt 151.9 lb

## 2015-02-14 DIAGNOSIS — Z9221 Personal history of antineoplastic chemotherapy: Secondary | ICD-10-CM | POA: Diagnosis not present

## 2015-02-14 DIAGNOSIS — R531 Weakness: Secondary | ICD-10-CM | POA: Insufficient documentation

## 2015-02-14 DIAGNOSIS — Z809 Family history of malignant neoplasm, unspecified: Secondary | ICD-10-CM | POA: Insufficient documentation

## 2015-02-14 DIAGNOSIS — F419 Anxiety disorder, unspecified: Secondary | ICD-10-CM | POA: Insufficient documentation

## 2015-02-14 DIAGNOSIS — I10 Essential (primary) hypertension: Secondary | ICD-10-CM

## 2015-02-14 DIAGNOSIS — C8330 Diffuse large B-cell lymphoma, unspecified site: Secondary | ICD-10-CM | POA: Diagnosis not present

## 2015-02-14 DIAGNOSIS — K219 Gastro-esophageal reflux disease without esophagitis: Secondary | ICD-10-CM | POA: Insufficient documentation

## 2015-02-14 DIAGNOSIS — M542 Cervicalgia: Secondary | ICD-10-CM | POA: Insufficient documentation

## 2015-02-14 DIAGNOSIS — Z79899 Other long term (current) drug therapy: Secondary | ICD-10-CM

## 2015-02-14 DIAGNOSIS — D649 Anemia, unspecified: Secondary | ICD-10-CM | POA: Insufficient documentation

## 2015-02-14 DIAGNOSIS — R5383 Other fatigue: Secondary | ICD-10-CM

## 2015-02-14 DIAGNOSIS — Z87891 Personal history of nicotine dependence: Secondary | ICD-10-CM

## 2015-02-14 DIAGNOSIS — F329 Major depressive disorder, single episode, unspecified: Secondary | ICD-10-CM | POA: Insufficient documentation

## 2015-02-14 DIAGNOSIS — B20 Human immunodeficiency virus [HIV] disease: Secondary | ICD-10-CM

## 2015-02-14 DIAGNOSIS — Z923 Personal history of irradiation: Secondary | ICD-10-CM | POA: Diagnosis not present

## 2015-02-14 DIAGNOSIS — R918 Other nonspecific abnormal finding of lung field: Secondary | ICD-10-CM

## 2015-02-14 DIAGNOSIS — C8590 Non-Hodgkin lymphoma, unspecified, unspecified site: Principal | ICD-10-CM

## 2015-02-14 LAB — CBC WITH DIFFERENTIAL/PLATELET
BASOS ABS: 0 10*3/uL (ref 0–0.1)
Basophils Relative: 1 %
EOS ABS: 0.1 10*3/uL (ref 0–0.7)
Eosinophils Relative: 2 %
HCT: 36.8 % — ABNORMAL LOW (ref 40.0–52.0)
Hemoglobin: 12.5 g/dL — ABNORMAL LOW (ref 13.0–18.0)
LYMPHS PCT: 50 %
Lymphs Abs: 1.9 10*3/uL (ref 1.0–3.6)
MCH: 36.2 pg — AB (ref 26.0–34.0)
MCHC: 33.9 g/dL (ref 32.0–36.0)
MCV: 106.7 fL — AB (ref 80.0–100.0)
Monocytes Absolute: 0.4 10*3/uL (ref 0.2–1.0)
Monocytes Relative: 9 %
NEUTROS ABS: 1.5 10*3/uL (ref 1.4–6.5)
NEUTROS PCT: 38 %
Platelets: 157 10*3/uL (ref 150–440)
RBC: 3.45 MIL/uL — ABNORMAL LOW (ref 4.40–5.90)
RDW: 20.6 % — ABNORMAL HIGH (ref 11.5–14.5)
WBC: 3.9 10*3/uL (ref 3.8–10.6)

## 2015-02-14 LAB — HEPATIC FUNCTION PANEL
ALBUMIN: 4.5 g/dL (ref 3.5–5.0)
ALK PHOS: 68 U/L (ref 38–126)
ALT: 15 U/L — ABNORMAL LOW (ref 17–63)
AST: 20 U/L (ref 15–41)
BILIRUBIN DIRECT: 0.1 mg/dL (ref 0.1–0.5)
Indirect Bilirubin: 0.6 mg/dL (ref 0.3–0.9)
TOTAL PROTEIN: 7.2 g/dL (ref 6.5–8.1)
Total Bilirubin: 0.7 mg/dL (ref 0.3–1.2)

## 2015-02-14 LAB — LACTATE DEHYDROGENASE: LDH: 124 U/L (ref 98–192)

## 2015-02-14 LAB — CREATININE, SERUM
CREATININE: 1.17 mg/dL (ref 0.61–1.24)
GFR calc non Af Amer: >60 mL/min (ref >60)

## 2015-02-28 ENCOUNTER — Other Ambulatory Visit: Payer: Self-pay | Admitting: *Deleted

## 2015-02-28 MED ORDER — OXYCODONE HCL 10 MG PO TABS: 10.0000 mg | ORAL_TABLET | Freq: Four times a day (QID) | ORAL | Status: DC | PRN

## 2015-02-28 MED ORDER — MORPHINE SULFATE ER 15 MG PO TBCR: EXTENDED_RELEASE_TABLET | ORAL | Status: DC

## 2015-02-28 NOTE — Telephone Encounter (Signed)
Informed that prescription is ready to pick up  

## 2015-03-04 ENCOUNTER — Encounter: Payer: Self-pay | Admitting: Internal Medicine

## 2015-03-04 DIAGNOSIS — C8590 Non-Hodgkin lymphoma, unspecified, unspecified site: Secondary | ICD-10-CM

## 2015-03-04 DIAGNOSIS — B20 Human immunodeficiency virus [HIV] disease: Secondary | ICD-10-CM

## 2015-03-04 HISTORY — DX: Human immunodeficiency virus (HIV) disease: B20

## 2015-03-04 NOTE — Progress Notes (Signed)
[place]  Telephone:([phone]) [phone] Fax:(336) [phone]     ID: Steven Compton OB: 1961/05/19  MR#: [id number]  KZS#:[id number]  Patient Care Team: Adrian Prows, MD as PCP - General (Infectious Diseases)  CHIEF COMPLAINT/DIAGNOSIS:  1.  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 Status post chemotherapy and radiation (Received 6 cycles CHOP chemotherapy, completed May 2006). In remission.  2. Left lung cavitary lesion diagnosed May 2012 - CT-guided biopsy on 03/11/11 not performed since he had significant clearing of left upper lobe cavitary lesion suggesting this is an infectious process.   PET scan February 22, 2011.  IMPRESSION:  1. There is abnormal uptake peripherally in the left upper lobe. This may  be neoplastic or infectious in etiology. I see no abnormal uptake elsewhere in the thorax.  2. Along the left aspect of the angle of the left mandible there is a focus of mildly increased uptake. A discrete nodule is not demonstrated on the CT images but this could reflect in a lymph node here.  3. I see no abnormal uptake within the abdomen or pelvis.    HISTORY OF PRESENT ILLNESS:  Patient returns for oncology followup, he was seen in Oct 2015. States he continues to have chronic fatigue and generalised weakness which is unchanged. Appetite is steady. Denies fevers or night sweats. Denies any new chest pain or hemoptysis. Denies feeling any new lymph node masses on exam. Denies feeling new lymph node masses on self-exam. Has chronic left upper neck pain, overall controlled with current opioid regimen, pain fluctuates 0-4/10.   REVIEW OF SYSTEMS:   ROS As in HPI above. In addition, no new headaches or focal weakness.  No new sore throat, cough, shortness of breath, sputum, hemoptysis or chest pain. No dizziness or palpitation. No abdominal pain, constipation, diarrhea, dysuria or hematuria. No new skin rash or bleeding symptoms. No new  paresthesias in extremities. No polyuria polydipsia. PS ECOG 1.  PAST MEDICAL HISTORY: Reviewed Past Medical History  Diagnosis Date  . AIDS-related lymphoma 03/04/2015          History of hypertension  AIDS         AIDS-related lymphoma   GERD  Depression  Anxiety  PAST SURGICAL HISTORY:Reviewed Appendectomy in 2000.  FAMILY HISTORY:Reviewed. Remarkable for breast cancer, leukemia, diabetes, cardiovascular heart disease, hypertension and anemia.  ADVANCED DIRECTIVES:  [place]; Living will  SOCIAL HISTORY: Reviewed Past history of ETOH use, Ex-smoking history of greater than 30 pack years and occasional recreational marijuana use.  Current Outpatient Prescriptions  Medication Sig Dispense Refill  . QUEtiapine (SEROQUEL XR) 200 MG 24 hr tablet Take 200 mg by mouth at bedtime.    Marland Kitchen morphine (MS CONTIN) 15 MG 12 hr tablet Take 1 tablet q AM and 2 tablets q PM orally 90 tablet 0  . Oxycodone HCl 10 MG TABS Take 1 tablet (10 mg total) by mouth every 6 (six) hours as needed. 120 tablet 0   No current facility-administered medications for this visit.    OBJECTIVE: Filed Vitals:   02/14/15 1528  BP:   Pulse:   Temp:   Resp: 18     Body mass index is 20.04 kg/(m^2).    ECOG FS:1 - Symptomatic but completely ambulatory  GENERAL: Patient is thin individual, otherwise alert and oriented and in no acute distress. No icterus. HEENT: EOMs intact. Oral exam negative for thrush. Chronic induration and tenderness in the left upper  neck is unchanged, no cervical adenopathy.. CVS: S1S2, regular LUNGS: Bilaterally clear to auscultation, no rhonchi. ABDOMEN: Soft, nontender. No hepatosplenomegaly clinically.  EXTREMITIES: No pedal edema. LYMPHATICS: No palpable adenopathy in axillary or inguinal areas.  LAB RESULTS:    Component Value Date/Time   NA 139 01/07/2014 1044   K 4.4 01/07/2014 1044   CL 102 01/07/2014 1044   CO2 30 01/07/2014 1044   GLUCOSE 113*  01/07/2014 1044   BUN 15 01/07/2014 1044   CREATININE 1.17 02/14/2015 1503   CREATININE 1.03 07/09/2014 1411   CALCIUM 9.3 01/07/2014 1044   PROT 7.2 02/14/2015 1503   PROT 7.5 07/09/2014 1411   ALBUMIN 4.5 02/14/2015 1503   ALBUMIN 3.8 07/09/2014 1411   AST 20 02/14/2015 1503   AST 45* 07/09/2014 1411   ALT 15* 02/14/2015 1503   ALT 25 07/09/2014 1411   ALKPHOS 68 02/14/2015 1503   ALKPHOS 78 07/09/2014 1411   BILITOT 0.7 02/14/2015 1503   GFRNONAA >60 02/14/2015 1503   GFRNONAA >60 01/07/2014 1044   GFRAA >60 02/14/2015 1503   GFRAA >60 01/07/2014 1044   Lab Results  Component Value Date   WBC 3.9 02/14/2015   NEUTROABS 1.5 02/14/2015   HGB 12.5* 02/14/2015   HCT 36.8* 02/14/2015   MCV 106.7* 02/14/2015   PLT 157 02/14/2015     STUDIES: 07/09/14 - CT Chest without contrast. IMPRESSION: Left upper lobe pulmonary nodule remains stable since most recent exam, although there has been minimal increase in size since earlier study in 2013. Continued followup recommended by CT in 6 months. Increased reticulonodular opacities in both lung bases, likely inflammatory or infectious in etiology. Attention recommended on follow-up CT.  01/14/14 - PET Scan. IMPRESSION: Low level FDG uptake is identified in the left upper lobe pulmonary nodule. Uptake is within the infectious/ inflammatory range, but well differentiated or low-grade neoplasm can be poorly FDG avid, so continued close attention is recommended. Diffuse FDG uptake in the esophagus. Given the diffuse involvement, esophagitis would be a consideration.  STAGING: AIDS-related lymphoma   Staging form: Lymphoid Neoplasms, AJCC 6th Edition     Clinical stage from 03/04/2015: Stage II - Signed by Leia Alf, MD on 03/04/2015   ASSESSMENT / PLAN:   1. AIDS related lymphoma, left lung lesion -  Reviewed labs and d/w patient. Clinically doing steady, no B symptoms or new lymphadenopathy. CT chest Oct 2015 reported LUL lung nodule  remains stable. Plan is continued surveillance. Will followup in 1 year with labs including CBC, Cr, LDH. Will consider radiological evaluation if any new symptoms or findings to suggest recurrent lymphoma.    2. Chronic neck pain - under control, continue current opioid regimen (MS Contin 15 mg qAM and 30 mg qPM, along with oxycodone when necessary).  3. Anemia - mild, chronic. Monitor. 4. In between visits, the patient has been advised to call or come to the ER in case of fevers, new symptoms or acute sickness. He is agreeable to this plan.   Leia Alf, MD   03/04/2015 11:17 PM

## 2015-03-28 ENCOUNTER — Other Ambulatory Visit: Payer: Self-pay | Admitting: Family Medicine

## 2015-03-31 ENCOUNTER — Telehealth: Payer: Self-pay | Admitting: *Deleted

## 2015-03-31 MED ORDER — MORPHINE SULFATE ER 15 MG PO TBCR: EXTENDED_RELEASE_TABLET | ORAL | Status: DC

## 2015-03-31 MED ORDER — OXYCODONE HCL 10 MG PO TABS: 10.0000 mg | ORAL_TABLET | Freq: Four times a day (QID) | ORAL | Status: DC | PRN

## 2015-03-31 NOTE — Telephone Encounter (Signed)
Informed that prescription is ready to pick up  

## 2015-04-30 ENCOUNTER — Telehealth: Payer: Self-pay | Admitting: *Deleted

## 2015-04-30 MED ORDER — MORPHINE SULFATE ER 15 MG PO TBCR: EXTENDED_RELEASE_TABLET | ORAL | Status: DC

## 2015-04-30 MED ORDER — OXYCODONE HCL 10 MG PO TABS: 10.0000 mg | ORAL_TABLET | Freq: Four times a day (QID) | ORAL | Status: DC | PRN

## 2015-04-30 NOTE — Telephone Encounter (Signed)
Informed that prescription is ready to pick up  

## 2015-05-28 ENCOUNTER — Telehealth: Payer: Self-pay | Admitting: *Deleted

## 2015-05-28 MED ORDER — OXYCODONE HCL 10 MG PO TABS: 10.0000 mg | ORAL_TABLET | Freq: Four times a day (QID) | ORAL | Status: DC | PRN

## 2015-05-28 MED ORDER — MORPHINE SULFATE ER 15 MG PO TBCR: EXTENDED_RELEASE_TABLET | ORAL | Status: DC

## 2015-05-28 NOTE — Telephone Encounter (Signed)
Informed that prescription is ready to pick up  

## 2015-06-27 ENCOUNTER — Other Ambulatory Visit: Payer: Self-pay | Admitting: *Deleted

## 2015-06-27 MED ORDER — MORPHINE SULFATE ER 15 MG PO TBCR: EXTENDED_RELEASE_TABLET | ORAL | Status: DC

## 2015-06-27 MED ORDER — OXYCODONE HCL 10 MG PO TABS: 10.0000 mg | ORAL_TABLET | Freq: Four times a day (QID) | ORAL | Status: DC | PRN

## 2015-06-27 NOTE — Telephone Encounter (Signed)
Informed that prescription is ready to pick up Also per Dr Rogue Bussing, appt made for next Friday 10/21 at 330 per pt choice to establish care with md

## 2015-07-02 ENCOUNTER — Encounter: Payer: Self-pay | Admitting: *Deleted

## 2015-07-04 ENCOUNTER — Inpatient Hospital Stay: Payer: Medicare Other | Attending: Internal Medicine | Admitting: Internal Medicine

## 2015-07-04 VITALS — BP 124/83 | HR 87 | Temp 97.4°F | Wt 153.9 lb

## 2015-07-04 DIAGNOSIS — M542 Cervicalgia: Secondary | ICD-10-CM | POA: Insufficient documentation

## 2015-07-04 DIAGNOSIS — Z79899 Other long term (current) drug therapy: Secondary | ICD-10-CM

## 2015-07-04 DIAGNOSIS — B20 Human immunodeficiency virus [HIV] disease: Secondary | ICD-10-CM

## 2015-07-04 DIAGNOSIS — Z7982 Long term (current) use of aspirin: Secondary | ICD-10-CM | POA: Insufficient documentation

## 2015-07-04 DIAGNOSIS — C8590 Non-Hodgkin lymphoma, unspecified, unspecified site: Secondary | ICD-10-CM

## 2015-07-04 DIAGNOSIS — G8929 Other chronic pain: Secondary | ICD-10-CM | POA: Insufficient documentation

## 2015-07-04 DIAGNOSIS — Z8572 Personal history of non-Hodgkin lymphomas: Secondary | ICD-10-CM | POA: Diagnosis not present

## 2015-07-04 MED ORDER — OXYCODONE HCL 10 MG PO TABS: 10.0000 mg | ORAL_TABLET | Freq: Four times a day (QID) | ORAL | Status: DC | PRN

## 2015-07-04 MED ORDER — MORPHINE SULFATE ER 15 MG PO TBCR: EXTENDED_RELEASE_TABLET | ORAL | Status: DC

## 2015-07-04 NOTE — Progress Notes (Signed)
[facility] OFFICE PROGRESS NOTE  Patient Care Team: Adrian Prows, MD as PCP - General (Infectious Diseases)   SUMMARY OF ONCOLOGIC HISTORY:  # 2006-DLBCL Stage II s/p R-CHOP x6; NED  # SEP 2016- ANAL Lesions s/p Resection [[facility]]; non-invasive AIN  # Chronic neck pain/ AIDS on ART  INTERVAL HISTORY:  54 year old male patient with a history of AIDS related to lymphoma 2006 status post chemotherapy and chronic pain from previous neck surgery is here for follow-up.  Patient states that he had been recently followed up at [facility] because of anal lesions for which he had resection. No other lesions were invasive. He is awaiting a follow-up with the surgeon at [facility].  Overall is doing well; denies any new lumps or bumps. Denies any night sweats or significant weight loss.  He does have chronic pain in his neck from previous neck surgery-and is on MS Contin/oxycodone for pain control.  REVIEW OF SYSTEMS:  A complete 10 point review of system is done which is negative except mentioned above/history of present illness.   PAST MEDICAL HISTORY :  Past Medical History  Diagnosis Date  . AIDS-related lymphoma ([place]) 03/04/2015  . AIDS (acquired immune deficiency syndrome) ([place])   . Lesion of left lung May 2012    PAST SURGICAL HISTORY :  No past surgical history on file.  FAMILY HISTORY :  No family history on file.  SOCIAL HISTORY:   Social History  Substance Use Topics  . Smoking status: Not on file  . Smokeless tobacco: Not on file  . Alcohol Use: Not on file    ALLERGIES:  is allergic to abacavir; zalcitabine; and duloxetine hcl.  MEDICATIONS:  Current Outpatient Prescriptions  Medication Sig Dispense Refill  . albuterol (PROVENTIL HFA) 108 (90 BASE) MCG/ACT inhaler Inhale into the lungs.    . ALPRAZolam (XANAX) 1 MG tablet Take 1 mg by mouth.    . fluconazole (DIFLUCAN) 200 MG tablet Take 200 mg by mouth.    . folic acid (FOLVITE) 203 MCG tablet Take 0.8 mg by  mouth.    Marland Kitchen ibuprofen (ADVIL,MOTRIN) 200 MG tablet Take 400 mg by mouth.    Marland Kitchen ketoconazole (NIZORAL) 2 % cream Apply topically.    Marland Kitchen lamiVUDine-zidovudine (COMBIVIR) 150-300 MG tablet Take by mouth.    . levocetirizine (XYZAL) 5 MG tablet Take 5 mg by mouth.    . levocetirizine (XYZAL) 5 MG tablet Take by mouth.    . lopinavir-ritonavir (KALETRA) 200-50 MG tablet Take by mouth.    . loratadine (CLARITIN) 10 MG tablet Take by mouth.    . meclizine (ANTIVERT) 25 MG tablet Take 25 mg by mouth.    . mirtazapine (REMERON) 30 MG tablet Take 60 mg by mouth.    . mometasone (NASONEX) 50 MCG/ACT nasal spray Frequency:QD   Dosage:50   MCG  Instructions:  Note:Dose: 50 MCG    . morphine (MS CONTIN) 15 MG 12 hr tablet Take 1 tablet q AM and 2 tablets q PM orally 90 tablet 0  . omeprazole (PRILOSEC) 40 MG capsule Take 40 mg by mouth.    . Oxycodone HCl 10 MG TABS Take 1 tablet (10 mg total) by mouth every 6 (six) hours as needed. 120 tablet 0  . promethazine (PHENERGAN) 25 MG tablet Take 25 mg by mouth.    . QUEtiapine (SEROQUEL XR) 200 MG 24 hr tablet Take 200 mg by mouth at bedtime.    Marland Kitchen tenofovir (VIREAD) 300 MG tablet Take 300  mg by mouth.    Marland Kitchen aspirin EC 81 MG tablet Take 81 mg by mouth.     No current facility-administered medications for this visit.    PHYSICAL EXAMINATION: ECOG PERFORMANCE STATUS: 0 - Asymptomatic  BP 124/83 mmHg  Pulse 87  Temp(Src) 97.4 F (36.3 C) (Tympanic)  Wt 153 lb 14.1 oz (69.8 kg)  Filed Weights   07/04/15 1554  Weight: 153 lb 14.1 oz (69.8 kg)    GENERAL: Well-nourished well-developed; Alert, no distress and comfortable.   Accompanied by his partner. EYES: no pallor or icterus OROPHARYNX: no thrush or ulceration; good dentition  NECK: supple, no masses felt; right neck surgery incision noted. LYMPH:  no palpable lymphadenopathy in the cervical, axillary or inguinal regions LUNGS: clear to auscultation and  No wheeze or crackles HEART/CVS: regular rate &  rhythm and no murmurs; No lower extremity edema ABDOMEN:abdomen soft, non-tender and normal bowel sounds Musculoskeletal:no cyanosis of digits and no clubbing  PSYCH: alert & oriented x 3 with fluent speech NEURO: no focal motor/sensory deficits SKIN:  no rashes or significant lesions  LABORATORY DATA:  I have reviewed the data as listed    Component Value Date/Time   NA 139 01/07/2014 1044   K 4.4 01/07/2014 1044   CL 102 01/07/2014 1044   CO2 30 01/07/2014 1044   GLUCOSE 113* 01/07/2014 1044   BUN 15 01/07/2014 1044   CREATININE 1.17 02/14/2015 1503   CREATININE 1.03 07/09/2014 1411   CALCIUM 9.3 01/07/2014 1044   PROT 7.2 02/14/2015 1503   PROT 7.5 07/09/2014 1411   ALBUMIN 4.5 02/14/2015 1503   ALBUMIN 3.8 07/09/2014 1411   AST 20 02/14/2015 1503   AST 45* 07/09/2014 1411   ALT 15* 02/14/2015 1503   ALT 25 07/09/2014 1411   ALKPHOS 68 02/14/2015 1503   ALKPHOS 78 07/09/2014 1411   BILITOT 0.7 02/14/2015 1503   BILITOT 0.4 07/09/2014 1411   GFRNONAA >60 02/14/2015 1503   GFRNONAA >60 07/09/2014 1411   GFRNONAA >60 01/07/2014 1044   GFRAA >60 02/14/2015 1503   GFRAA >60 07/09/2014 1411   GFRAA >60 01/07/2014 1044    No results found for: SPEP, UPEP  Lab Results  Component Value Date   WBC 3.9 02/14/2015   NEUTROABS 1.5 02/14/2015   HGB 12.5* 02/14/2015   HCT 36.8* 02/14/2015   MCV 106.7* 02/14/2015   PLT 157 02/14/2015      Chemistry      Component Value Date/Time   NA 139 01/07/2014 1044   K 4.4 01/07/2014 1044   CL 102 01/07/2014 1044   CO2 30 01/07/2014 1044   BUN 15 01/07/2014 1044   CREATININE 1.17 02/14/2015 1503   CREATININE 1.03 07/09/2014 1411      Component Value Date/Time   CALCIUM 9.3 01/07/2014 1044   ALKPHOS 68 02/14/2015 1503   ALKPHOS 78 07/09/2014 1411   AST 20 02/14/2015 1503   AST 45* 07/09/2014 1411   ALT 15* 02/14/2015 1503   ALT 25 07/09/2014 1411   BILITOT 0.7 02/14/2015 1503   BILITOT 0.4 07/09/2014 1411        RADIOGRAPHIC STUDIES: I have personally reviewed the radiological images as listed and agreed with the findings in the report. No results found.   ASSESSMENT & PLAN:   # AIDS related DLBCL Stage II s/p R-CHOP [2006]- clinically no evidence of recurrence noted.  # Anal lesions/noninvasive squamous cell s/p resection at [facility] [sep 2016]  # chronic pain from his left  neck surgery-likely neuropathic. prescription for MS Contin; oxycodone given.  Patient will follow-up with me in approximately one year.  No orders of the defined types were placed in this encounter.   All questions were answered. The patient knows to call the clinic with any problems, questions or concerns. No barriers to learning was detected. I spent 15 minutes counseling the patient face to face. The total time spent in the appointment was 30 minutes and more than 50% was on counseling and review of test results     Cammie Sickle, MD 07/04/2015 4:01 PM

## 2015-07-04 NOTE — Progress Notes (Signed)
Patient here today to get established with MD for continuation of care.  Patient states he had hemorroid surgery on 05-30-15 @ [facility].  States they removed an aarea of pre-cancerous cells.

## 2015-07-25 ENCOUNTER — Other Ambulatory Visit: Payer: Self-pay | Admitting: *Deleted

## 2015-07-25 DIAGNOSIS — C8590 Non-Hodgkin lymphoma, unspecified, unspecified site: Principal | ICD-10-CM

## 2015-07-25 DIAGNOSIS — B20 Human immunodeficiency virus [HIV] disease: Secondary | ICD-10-CM

## 2015-07-25 MED ORDER — MORPHINE SULFATE ER 15 MG PO TBCR: EXTENDED_RELEASE_TABLET | ORAL | Status: DC

## 2015-07-25 MED ORDER — OXYCODONE HCL 10 MG PO TABS
10.0000 mg | ORAL_TABLET | Freq: Four times a day (QID) | ORAL | Status: DC | PRN
Start: 2015-07-25 — End: 2015-08-25

## 2015-07-25 NOTE — Telephone Encounter (Signed)
Informed that prescription is ready to pick up  

## 2015-07-25 NOTE — Telephone Encounter (Signed)
States rx written on 10/21 was given back to [facility] as it was not time for a refill on that date

## 2015-08-25 ENCOUNTER — Telehealth: Payer: Self-pay | Admitting: *Deleted

## 2015-08-25 DIAGNOSIS — C8590 Non-Hodgkin lymphoma, unspecified, unspecified site: Principal | ICD-10-CM

## 2015-08-25 DIAGNOSIS — B20 Human immunodeficiency virus [HIV] disease: Secondary | ICD-10-CM

## 2015-08-25 MED ORDER — OXYCODONE HCL 10 MG PO TABS: 10.0000 mg | ORAL_TABLET | Freq: Four times a day (QID) | ORAL | Status: DC | PRN

## 2015-08-25 MED ORDER — MORPHINE SULFATE ER 15 MG PO TBCR: EXTENDED_RELEASE_TABLET | ORAL | Status: DC

## 2015-08-25 NOTE — Telephone Encounter (Signed)
Informed that prescription is ready to pick up  

## 2015-09-24 ENCOUNTER — Telehealth: Payer: Self-pay | Admitting: *Deleted

## 2015-09-24 ENCOUNTER — Other Ambulatory Visit: Payer: Self-pay | Admitting: Infectious Diseases

## 2015-09-24 DIAGNOSIS — R05 Cough: Secondary | ICD-10-CM

## 2015-09-24 DIAGNOSIS — B20 Human immunodeficiency virus [HIV] disease: Secondary | ICD-10-CM

## 2015-09-24 DIAGNOSIS — R053 Chronic cough: Secondary | ICD-10-CM

## 2015-09-24 DIAGNOSIS — R911 Solitary pulmonary nodule: Secondary | ICD-10-CM

## 2015-09-24 DIAGNOSIS — C8590 Non-Hodgkin lymphoma, unspecified, unspecified site: Principal | ICD-10-CM

## 2015-09-24 MED ORDER — MORPHINE SULFATE ER 15 MG PO TBCR: EXTENDED_RELEASE_TABLET | ORAL | Status: DC

## 2015-09-24 MED ORDER — OXYCODONE HCL 10 MG PO TABS: 10.0000 mg | ORAL_TABLET | Freq: Four times a day (QID) | ORAL | Status: DC | PRN

## 2015-09-24 NOTE — Telephone Encounter (Signed)
Informed that prescription is ready to pick up  

## 2015-10-02 ENCOUNTER — Ambulatory Visit: Payer: Medicare Other | Attending: Infectious Diseases

## 2015-10-24 ENCOUNTER — Other Ambulatory Visit: Payer: Self-pay | Admitting: *Deleted

## 2015-10-24 DIAGNOSIS — B20 Human immunodeficiency virus [HIV] disease: Secondary | ICD-10-CM

## 2015-10-24 DIAGNOSIS — C8590 Non-Hodgkin lymphoma, unspecified, unspecified site: Principal | ICD-10-CM

## 2015-10-24 MED ORDER — OXYCODONE HCL 10 MG PO TABS: 10.0000 mg | ORAL_TABLET | Freq: Four times a day (QID) | ORAL | Status: DC | PRN

## 2015-10-24 MED ORDER — MORPHINE SULFATE ER 15 MG PO TBCR: EXTENDED_RELEASE_TABLET | ORAL | Status: DC

## 2015-10-24 NOTE — Telephone Encounter (Signed)
Informed that prescription is ready to pick up  

## 2015-11-21 ENCOUNTER — Other Ambulatory Visit: Payer: Self-pay | Admitting: *Deleted

## 2015-11-21 DIAGNOSIS — B20 Human immunodeficiency virus [HIV] disease: Secondary | ICD-10-CM

## 2015-11-21 DIAGNOSIS — C8590 Non-Hodgkin lymphoma, unspecified, unspecified site: Principal | ICD-10-CM

## 2015-11-21 MED ORDER — MORPHINE SULFATE ER 15 MG PO TBCR: EXTENDED_RELEASE_TABLET | ORAL | Status: DC

## 2015-11-21 MED ORDER — OXYCODONE HCL 10 MG PO TABS: 10.0000 mg | ORAL_TABLET | Freq: Four times a day (QID) | ORAL | Status: DC | PRN

## 2015-12-22 ENCOUNTER — Other Ambulatory Visit: Payer: Self-pay | Admitting: *Deleted

## 2015-12-22 DIAGNOSIS — C8590 Non-Hodgkin lymphoma, unspecified, unspecified site: Principal | ICD-10-CM

## 2015-12-22 DIAGNOSIS — B20 Human immunodeficiency virus [HIV] disease: Secondary | ICD-10-CM

## 2015-12-22 MED ORDER — MORPHINE SULFATE ER 15 MG PO TBCR: EXTENDED_RELEASE_TABLET | ORAL | Status: DC

## 2015-12-22 MED ORDER — OXYCODONE HCL 10 MG PO TABS: 10.0000 mg | ORAL_TABLET | Freq: Four times a day (QID) | ORAL | Status: DC | PRN

## 2016-01-21 ENCOUNTER — Other Ambulatory Visit: Payer: Self-pay | Admitting: *Deleted

## 2016-01-21 DIAGNOSIS — B20 Human immunodeficiency virus [HIV] disease: Secondary | ICD-10-CM

## 2016-01-21 DIAGNOSIS — C8590 Non-Hodgkin lymphoma, unspecified, unspecified site: Principal | ICD-10-CM

## 2016-01-21 MED ORDER — OXYCODONE HCL 10 MG PO TABS
10.0000 mg | ORAL_TABLET | Freq: Four times a day (QID) | ORAL | Status: DC | PRN
Start: 2016-01-21 — End: 2016-02-20

## 2016-01-21 MED ORDER — MORPHINE SULFATE ER 15 MG PO TBCR: EXTENDED_RELEASE_TABLET | ORAL | Status: DC

## 2016-01-26 ENCOUNTER — Other Ambulatory Visit: Payer: Self-pay | Admitting: Infectious Diseases

## 2016-01-26 DIAGNOSIS — I10 Essential (primary) hypertension: Secondary | ICD-10-CM | POA: Diagnosis not present

## 2016-01-26 DIAGNOSIS — B20 Human immunodeficiency virus [HIV] disease: Secondary | ICD-10-CM | POA: Diagnosis not present

## 2016-01-26 DIAGNOSIS — Z8661 Personal history of infections of the central nervous system: Secondary | ICD-10-CM | POA: Diagnosis not present

## 2016-01-26 DIAGNOSIS — C8590 Non-Hodgkin lymphoma, unspecified, unspecified site: Secondary | ICD-10-CM

## 2016-01-26 DIAGNOSIS — R911 Solitary pulmonary nodule: Secondary | ICD-10-CM | POA: Diagnosis not present

## 2016-01-26 DIAGNOSIS — E782 Mixed hyperlipidemia: Secondary | ICD-10-CM | POA: Diagnosis not present

## 2016-02-10 ENCOUNTER — Other Ambulatory Visit: Payer: Self-pay | Admitting: *Deleted

## 2016-02-10 DIAGNOSIS — B20 Human immunodeficiency virus [HIV] disease: Secondary | ICD-10-CM

## 2016-02-10 DIAGNOSIS — C8590 Non-Hodgkin lymphoma, unspecified, unspecified site: Principal | ICD-10-CM

## 2016-02-13 ENCOUNTER — Inpatient Hospital Stay: Payer: Medicare Other

## 2016-02-13 ENCOUNTER — Ambulatory Visit: Payer: Medicare Other | Admitting: Internal Medicine

## 2016-02-13 ENCOUNTER — Inpatient Hospital Stay: Payer: Medicare Other | Admitting: Internal Medicine

## 2016-02-20 ENCOUNTER — Other Ambulatory Visit: Payer: Self-pay | Admitting: *Deleted

## 2016-02-20 DIAGNOSIS — C8590 Non-Hodgkin lymphoma, unspecified, unspecified site: Principal | ICD-10-CM

## 2016-02-20 DIAGNOSIS — B20 Human immunodeficiency virus [HIV] disease: Secondary | ICD-10-CM

## 2016-02-20 MED ORDER — MORPHINE SULFATE ER 15 MG PO TBCR: EXTENDED_RELEASE_TABLET | ORAL | Status: DC

## 2016-02-20 MED ORDER — OXYCODONE HCL 10 MG PO TABS: 10.0000 mg | ORAL_TABLET | Freq: Four times a day (QID) | ORAL | Status: DC | PRN

## 2016-03-05 ENCOUNTER — Inpatient Hospital Stay: Payer: Medicare Other | Attending: Family Medicine

## 2016-03-05 ENCOUNTER — Other Ambulatory Visit: Payer: Self-pay | Admitting: Family Medicine

## 2016-03-05 ENCOUNTER — Inpatient Hospital Stay (HOSPITAL_BASED_OUTPATIENT_CLINIC_OR_DEPARTMENT_OTHER): Payer: Medicare Other | Admitting: Family Medicine

## 2016-03-05 VITALS — BP 129/84 | HR 82 | Temp 97.5°F | Resp 18 | Wt 150.9 lb

## 2016-03-05 DIAGNOSIS — M542 Cervicalgia: Secondary | ICD-10-CM | POA: Insufficient documentation

## 2016-03-05 DIAGNOSIS — G8929 Other chronic pain: Secondary | ICD-10-CM | POA: Diagnosis not present

## 2016-03-05 DIAGNOSIS — C8590 Non-Hodgkin lymphoma, unspecified, unspecified site: Principal | ICD-10-CM

## 2016-03-05 DIAGNOSIS — B20 Human immunodeficiency virus [HIV] disease: Secondary | ICD-10-CM | POA: Diagnosis not present

## 2016-03-05 DIAGNOSIS — Z87828 Personal history of other (healed) physical injury and trauma: Secondary | ICD-10-CM

## 2016-03-05 DIAGNOSIS — Z79899 Other long term (current) drug therapy: Secondary | ICD-10-CM | POA: Diagnosis not present

## 2016-03-05 DIAGNOSIS — Z8572 Personal history of non-Hodgkin lymphomas: Secondary | ICD-10-CM

## 2016-03-05 LAB — CBC WITH DIFFERENTIAL/PLATELET
BASOS ABS: 0 10*3/uL (ref 0–0.1)
Basophils Relative: 1 %
Eosinophils Absolute: 0 10*3/uL (ref 0–0.7)
Eosinophils Relative: 1 %
HEMATOCRIT: 37.5 % — AB (ref 40.0–52.0)
HEMOGLOBIN: 13.2 g/dL (ref 13.0–18.0)
LYMPHS ABS: 1.8 10*3/uL (ref 1.0–3.6)
LYMPHS PCT: 47 %
MCH: 39.8 pg — ABNORMAL HIGH (ref 26.0–34.0)
MCHC: 35.2 g/dL (ref 32.0–36.0)
MCV: 112.9 fL — AB (ref 80.0–100.0)
MONO ABS: 0.3 10*3/uL (ref 0.2–1.0)
Monocytes Relative: 8 %
NEUTROS ABS: 1.6 10*3/uL (ref 1.4–6.5)
NEUTROS PCT: 43 %
Platelets: 158 10*3/uL (ref 150–440)
RBC: 3.32 MIL/uL — ABNORMAL LOW (ref 4.40–5.90)
RDW: 13.4 % (ref 11.5–14.5)
WBC: 3.8 10*3/uL (ref 3.8–10.6)

## 2016-03-05 LAB — COMPREHENSIVE METABOLIC PANEL
ALBUMIN: 4.7 g/dL (ref 3.5–5.0)
ALK PHOS: 77 U/L (ref 38–126)
ALT: 23 U/L (ref 17–63)
AST: 23 U/L (ref 15–41)
Anion gap: 7 (ref 5–15)
BILIRUBIN TOTAL: 0.6 mg/dL (ref 0.3–1.2)
BUN: 14 mg/dL (ref 6–20)
CALCIUM: 9.3 mg/dL (ref 8.9–10.3)
CO2: 27 mmol/L (ref 22–32)
CREATININE: 1.06 mg/dL (ref 0.61–1.24)
Chloride: 103 mmol/L (ref 101–111)
GFR calc Af Amer: >60 mL/min (ref >60)
GLUCOSE: 134 mg/dL — AB (ref 65–99)
POTASSIUM: 3.4 mmol/L — AB (ref 3.5–5.1)
Sodium: 137 mmol/L (ref 135–145)
Total Protein: 7.3 g/dL (ref 6.5–8.1)

## 2016-03-05 LAB — LACTATE DEHYDROGENASE: LDH: 152 U/L (ref 98–192)

## 2016-03-05 NOTE — Progress Notes (Signed)
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 Patient also with chronic pain from previous neck surgery.  Patient states that he had been recently followed up at [facility] because of anal lesions for which he had resection. No other lesions were invasive.  Overall is doing very well; denies any new lumps or bumps. Denies any fever, night sweats, or significant weight loss.  He does have chronic pain in his neck from previous neck surgery and is on MS Contin/oxycodone for pain control.  REVIEW OF SYSTEMS:  A complete 10 point review of system is done which is negative except mentioned above/history of present illness.   PAST MEDICAL HISTORY :  Past Medical History  Diagnosis Date  . AIDS-related lymphoma ([place]) 03/04/2015  . AIDS (acquired immune deficiency syndrome) ([place])   . Lesion of left lung May 2012    PAST SURGICAL HISTORY :  No past surgical history on file.  FAMILY HISTORY :  No family history on file.  SOCIAL HISTORY:   Social History  Substance Use Topics  . Smoking status: Not on file  . Smokeless tobacco: Not on file  . Alcohol Use: Not on file    ALLERGIES:  is allergic to abacavir; zalcitabine; and duloxetine hcl.  MEDICATIONS:  Current Outpatient Prescriptions  Medication Sig Dispense Refill  . albuterol (PROVENTIL HFA) 108 (90 BASE) MCG/ACT inhaler Inhale into the lungs.    . ALPRAZolam (XANAX) 1 MG tablet Take 1 mg by mouth.    Marland Kitchen aspirin EC 81 MG tablet Take 81 mg by mouth.    . folic acid (FOLVITE) A999333 MCG tablet Take 0.8 mg by mouth.    Marland Kitchen ibuprofen  (ADVIL,MOTRIN) 200 MG tablet Take 400 mg by mouth.    Marland Kitchen ketoconazole (NIZORAL) 2 % cream Apply topically.    Marland Kitchen lamiVUDine-zidovudine (COMBIVIR) 150-300 MG tablet Take by mouth.    . levocetirizine (XYZAL) 5 MG tablet Take 5 mg by mouth.    . lopinavir-ritonavir (KALETRA) 200-50 MG tablet Take by mouth.    . loratadine (CLARITIN) 10 MG tablet Take by mouth.    . meclizine (ANTIVERT) 25 MG tablet Take 25 mg by mouth.    . mirtazapine (REMERON) 30 MG tablet Take 60 mg by mouth.    . mometasone (NASONEX) 50 MCG/ACT nasal spray Frequency:QD   Dosage:50   MCG  Instructions:  Note:Dose: 50 MCG    . morphine (MS CONTIN) 15 MG 12 hr tablet Take 1 tablet q AM and 2 tablets q PM orally 90 tablet 0  . omeprazole (PRILOSEC) 40 MG capsule Take 40 mg by mouth.    . Oxycodone HCl 10 MG TABS Take 1 tablet (10 mg total) by mouth every 6 (six) hours as needed. 120 tablet 0  . promethazine (PHENERGAN) 25 MG tablet Take 25 mg by mouth.    . QUEtiapine (SEROQUEL XR) 200 MG 24 hr tablet Take 200 mg by mouth at bedtime.    Marland Kitchen tenofovir (VIREAD) 300 MG tablet Take 300 mg by mouth.    . fluconazole (DIFLUCAN) 200 MG tablet Take 200 mg by mouth. Reported  on 03/05/2016     No current facility-administered medications for this visit.    PHYSICAL EXAMINATION: ECOG PERFORMANCE STATUS: 0 - Asymptomatic  BP 129/84 mmHg  Pulse 82  Temp(Src) 97.5 F (36.4 C) (Tympanic)  Resp 18  Wt 150 lb 14.5 oz (68.45 kg)  Filed Weights   03/05/16 1514  Weight: 150 lb 14.5 oz (68.45 kg)    GENERAL: Well-nourished well-developed; Alert, no distress and comfortable.   Accompanied by his partner. EYES: no pallor or icterus OROPHARYNX: no thrush or ulceration; good dentition  NECK: supple, no masses felt; right neck surgery incision noted. LYMPH:  no palpable lymphadenopathy in the cervical, axillary or inguinal regions LUNGS: clear to auscultation and  No wheeze or crackles HEART/CVS: regular rate & rhythm and no murmurs; No  lower extremity edema ABDOMEN:abdomen soft, non-tender and normal bowel sounds Musculoskeletal:no cyanosis of digits and no clubbing  PSYCH: alert & oriented x 3 with fluent speech NEURO: no focal motor/sensory deficits SKIN:  no rashes or significant lesions  LABORATORY DATA:  I have reviewed the data as listed    Component Value Date/Time   NA 139 01/07/2014 1044   K 4.4 01/07/2014 1044   CL 102 01/07/2014 1044   CO2 30 01/07/2014 1044   GLUCOSE 113* 01/07/2014 1044   BUN 15 01/07/2014 1044   CREATININE 1.17 02/14/2015 1503   CREATININE 1.03 07/09/2014 1411   CALCIUM 9.3 01/07/2014 1044   PROT 7.2 02/14/2015 1503   PROT 7.5 07/09/2014 1411   ALBUMIN 4.5 02/14/2015 1503   ALBUMIN 3.8 07/09/2014 1411   AST 20 02/14/2015 1503   AST 45* 07/09/2014 1411   ALT 15* 02/14/2015 1503   ALT 25 07/09/2014 1411   ALKPHOS 68 02/14/2015 1503   ALKPHOS 78 07/09/2014 1411   BILITOT 0.7 02/14/2015 1503   BILITOT 0.4 07/09/2014 1411   GFRNONAA >60 02/14/2015 1503   GFRNONAA >60 07/09/2014 1411   GFRNONAA >60 01/07/2014 1044   GFRAA >60 02/14/2015 1503   GFRAA >60 07/09/2014 1411   GFRAA >60 01/07/2014 1044    No results found for: SPEP, UPEP  Lab Results  Component Value Date   WBC 3.8 03/05/2016   NEUTROABS 1.6 03/05/2016   HGB 13.2 03/05/2016   HCT 37.5* 03/05/2016   MCV 112.9* 03/05/2016   PLT 158 03/05/2016      Chemistry      Component Value Date/Time   NA 139 01/07/2014 1044   K 4.4 01/07/2014 1044   CL 102 01/07/2014 1044   CO2 30 01/07/2014 1044   BUN 15 01/07/2014 1044   CREATININE 1.17 02/14/2015 1503   CREATININE 1.03 07/09/2014 1411      Component Value Date/Time   CALCIUM 9.3 01/07/2014 1044   ALKPHOS 68 02/14/2015 1503   ALKPHOS 78 07/09/2014 1411   AST 20 02/14/2015 1503   AST 45* 07/09/2014 1411   ALT 15* 02/14/2015 1503   ALT 25 07/09/2014 1411   BILITOT 0.7 02/14/2015 1503   BILITOT 0.4 07/09/2014 1411       ASSESSMENT & PLAN:   # AIDS  related DLBCL Stage II s/p R-CHOP [2006]- clinically no evidence of recurrence noted. Patient continues to have close follow-up with Dr. Adrian Prows.  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 Patient receives MS Contin and oxycodone by mouth from Dr. Rogue Bussing.  Patient will follow-up with Dr. Rogue Bussing in approximately one year.  Patient expressed understanding and was in agreement with this plan. He also understands that he can call clinic at any time with any questions, concerns, or complaints  Dr. Rogue Bussing was available for consultation and review of plan of care for this patient.    Evlyn Kanner, NP 03/05/2016 3:33 PM

## 2016-03-19 ENCOUNTER — Telehealth: Payer: Self-pay

## 2016-03-19 ENCOUNTER — Other Ambulatory Visit: Payer: Self-pay | Admitting: Family Medicine

## 2016-03-19 DIAGNOSIS — C8590 Non-Hodgkin lymphoma, unspecified, unspecified site: Principal | ICD-10-CM

## 2016-03-19 DIAGNOSIS — B20 Human immunodeficiency virus [HIV] disease: Secondary | ICD-10-CM

## 2016-03-19 MED ORDER — OXYCODONE HCL 10 MG PO TABS: 10.0000 mg | ORAL_TABLET | Freq: Four times a day (QID) | ORAL | Status: DC | PRN

## 2016-03-19 MED ORDER — MORPHINE SULFATE ER 15 MG PO TBCR: EXTENDED_RELEASE_TABLET | ORAL | Status: DC

## 2016-03-19 NOTE — Telephone Encounter (Signed)
Patient called to request refill on MS Contin and Oxycodone prescriptions. Scripts filled by [facility], NP.  Patient called and notified that prescriptions are ready at our registration desk.

## 2016-04-19 ENCOUNTER — Other Ambulatory Visit: Payer: Self-pay | Admitting: *Deleted

## 2016-04-19 DIAGNOSIS — C8590 Non-Hodgkin lymphoma, unspecified, unspecified site: Principal | ICD-10-CM

## 2016-04-19 DIAGNOSIS — B20 Human immunodeficiency virus [HIV] disease: Secondary | ICD-10-CM

## 2016-04-19 MED ORDER — MORPHINE SULFATE ER 15 MG PO TBCR
EXTENDED_RELEASE_TABLET | ORAL | 0 refills | Status: DC
Start: 2016-04-19 — End: 2016-05-19

## 2016-04-19 MED ORDER — OXYCODONE HCL 10 MG PO TABS: 10.0000 mg | ORAL_TABLET | Freq: Four times a day (QID) | ORAL | 0 refills | Status: DC | PRN

## 2016-05-19 ENCOUNTER — Other Ambulatory Visit: Payer: Self-pay | Admitting: *Deleted

## 2016-05-19 DIAGNOSIS — B20 Human immunodeficiency virus [HIV] disease: Secondary | ICD-10-CM

## 2016-05-19 DIAGNOSIS — C8590 Non-Hodgkin lymphoma, unspecified, unspecified site: Principal | ICD-10-CM

## 2016-05-19 MED ORDER — MORPHINE SULFATE ER 15 MG PO TBCR: EXTENDED_RELEASE_TABLET | ORAL | 0 refills | Status: DC

## 2016-05-19 MED ORDER — OXYCODONE HCL 10 MG PO TABS: 10.0000 mg | ORAL_TABLET | Freq: Four times a day (QID) | ORAL | 0 refills | Status: DC | PRN

## 2016-06-01 ENCOUNTER — Other Ambulatory Visit: Payer: Self-pay | Admitting: Infectious Diseases

## 2016-06-01 DIAGNOSIS — C8590 Non-Hodgkin lymphoma, unspecified, unspecified site: Secondary | ICD-10-CM | POA: Diagnosis not present

## 2016-06-01 DIAGNOSIS — Z23 Encounter for immunization: Secondary | ICD-10-CM | POA: Diagnosis not present

## 2016-06-01 DIAGNOSIS — R911 Solitary pulmonary nodule: Secondary | ICD-10-CM

## 2016-06-01 DIAGNOSIS — E782 Mixed hyperlipidemia: Secondary | ICD-10-CM | POA: Diagnosis not present

## 2016-06-01 DIAGNOSIS — B20 Human immunodeficiency virus [HIV] disease: Secondary | ICD-10-CM

## 2016-06-01 DIAGNOSIS — I10 Essential (primary) hypertension: Secondary | ICD-10-CM | POA: Diagnosis not present

## 2016-06-09 DIAGNOSIS — L989 Disorder of the skin and subcutaneous tissue, unspecified: Secondary | ICD-10-CM | POA: Diagnosis not present

## 2016-06-09 DIAGNOSIS — B078 Other viral warts: Secondary | ICD-10-CM | POA: Diagnosis not present

## 2016-06-10 ENCOUNTER — Ambulatory Visit
Admission: RE | Admit: 2016-06-10 | Discharge: 2016-06-10 | Disposition: A | Discharge status: Home / Self Care | Payer: Medicare Other | Source: Ambulatory Visit | Attending: Infectious Diseases | Admitting: Infectious Diseases

## 2016-06-10 DIAGNOSIS — Z8572 Personal history of non-Hodgkin lymphomas: Secondary | ICD-10-CM | POA: Insufficient documentation

## 2016-06-10 DIAGNOSIS — R911 Solitary pulmonary nodule: Secondary | ICD-10-CM | POA: Diagnosis not present

## 2016-06-10 DIAGNOSIS — B20 Human immunodeficiency virus [HIV] disease: Secondary | ICD-10-CM

## 2016-06-10 DIAGNOSIS — C8590 Non-Hodgkin lymphoma, unspecified, unspecified site: Secondary | ICD-10-CM

## 2016-06-10 DIAGNOSIS — R918 Other nonspecific abnormal finding of lung field: Secondary | ICD-10-CM | POA: Diagnosis not present

## 2016-06-18 ENCOUNTER — Other Ambulatory Visit: Payer: Self-pay | Admitting: *Deleted

## 2016-06-18 DIAGNOSIS — C8590 Non-Hodgkin lymphoma, unspecified, unspecified site: Principal | ICD-10-CM

## 2016-06-18 DIAGNOSIS — B20 Human immunodeficiency virus [HIV] disease: Secondary | ICD-10-CM

## 2016-06-18 MED ORDER — OXYCODONE HCL 10 MG PO TABS: 10.0000 mg | ORAL_TABLET | Freq: Four times a day (QID) | ORAL | 0 refills | Status: DC | PRN

## 2016-06-18 MED ORDER — MORPHINE SULFATE ER 15 MG PO TBCR: EXTENDED_RELEASE_TABLET | ORAL | 0 refills | Status: DC

## 2016-07-05 ENCOUNTER — Ambulatory Visit: Payer: Medicare Other | Admitting: Internal Medicine

## 2016-07-19 ENCOUNTER — Other Ambulatory Visit: Payer: Self-pay | Admitting: *Deleted

## 2016-07-19 DIAGNOSIS — C8590 Non-Hodgkin lymphoma, unspecified, unspecified site: Principal | ICD-10-CM

## 2016-07-19 DIAGNOSIS — B20 Human immunodeficiency virus [HIV] disease: Secondary | ICD-10-CM

## 2016-07-19 MED ORDER — MORPHINE SULFATE ER 15 MG PO TBCR: EXTENDED_RELEASE_TABLET | ORAL | 0 refills | Status: DC

## 2016-07-19 MED ORDER — OXYCODONE HCL 10 MG PO TABS: 10.0000 mg | ORAL_TABLET | Freq: Four times a day (QID) | ORAL | 0 refills | Status: DC | PRN

## 2016-08-17 ENCOUNTER — Other Ambulatory Visit: Payer: Self-pay | Admitting: *Deleted

## 2016-08-17 DIAGNOSIS — B20 Human immunodeficiency virus [HIV] disease: Secondary | ICD-10-CM

## 2016-08-17 DIAGNOSIS — C8590 Non-Hodgkin lymphoma, unspecified, unspecified site: Principal | ICD-10-CM

## 2016-08-18 MED ORDER — MORPHINE SULFATE ER 15 MG PO TBCR: EXTENDED_RELEASE_TABLET | ORAL | 0 refills | Status: DC

## 2016-08-18 MED ORDER — OXYCODONE HCL 10 MG PO TABS: 10.0000 mg | ORAL_TABLET | Freq: Four times a day (QID) | ORAL | 0 refills | Status: DC | PRN

## 2016-09-16 ENCOUNTER — Other Ambulatory Visit: Payer: Self-pay | Admitting: *Deleted

## 2016-09-16 DIAGNOSIS — C8590 Non-Hodgkin lymphoma, unspecified, unspecified site: Principal | ICD-10-CM

## 2016-09-16 DIAGNOSIS — B20 Human immunodeficiency virus [HIV] disease: Secondary | ICD-10-CM

## 2016-09-16 MED ORDER — OXYCODONE HCL 10 MG PO TABS: 10.0000 mg | ORAL_TABLET | Freq: Four times a day (QID) | ORAL | 0 refills | Status: DC | PRN

## 2016-09-16 MED ORDER — MORPHINE SULFATE ER 15 MG PO TBCR: EXTENDED_RELEASE_TABLET | ORAL | 0 refills | Status: DC

## 2016-10-08 DIAGNOSIS — J208 Acute bronchitis due to other specified organisms: Secondary | ICD-10-CM | POA: Diagnosis not present

## 2016-10-08 DIAGNOSIS — B9689 Other specified bacterial agents as the cause of diseases classified elsewhere: Secondary | ICD-10-CM | POA: Diagnosis not present

## 2016-10-15 ENCOUNTER — Other Ambulatory Visit: Payer: Self-pay | Admitting: *Deleted

## 2016-10-15 DIAGNOSIS — B20 Human immunodeficiency virus [HIV] disease: Secondary | ICD-10-CM

## 2016-10-15 DIAGNOSIS — C8590 Non-Hodgkin lymphoma, unspecified, unspecified site: Principal | ICD-10-CM

## 2016-10-15 MED ORDER — MORPHINE SULFATE ER 15 MG PO TBCR: EXTENDED_RELEASE_TABLET | ORAL | 0 refills | Status: DC

## 2016-10-15 MED ORDER — OXYCODONE HCL 10 MG PO TABS: 10.0000 mg | ORAL_TABLET | Freq: Four times a day (QID) | ORAL | 0 refills | Status: DC | PRN

## 2016-11-12 ENCOUNTER — Other Ambulatory Visit: Payer: Self-pay | Admitting: *Deleted

## 2016-11-12 DIAGNOSIS — B20 Human immunodeficiency virus [HIV] disease: Secondary | ICD-10-CM

## 2016-11-12 DIAGNOSIS — C8590 Non-Hodgkin lymphoma, unspecified, unspecified site: Principal | ICD-10-CM

## 2016-11-12 MED ORDER — OXYCODONE HCL 10 MG PO TABS: 10.0000 mg | ORAL_TABLET | Freq: Four times a day (QID) | ORAL | 0 refills | Status: DC | PRN

## 2016-11-12 MED ORDER — MORPHINE SULFATE ER 15 MG PO TBCR: EXTENDED_RELEASE_TABLET | ORAL | 0 refills | Status: DC

## 2016-12-14 ENCOUNTER — Telehealth: Payer: Self-pay | Admitting: *Deleted

## 2016-12-14 DIAGNOSIS — B20 Human immunodeficiency virus [HIV] disease: Secondary | ICD-10-CM

## 2016-12-14 DIAGNOSIS — C8590 Non-Hodgkin lymphoma, unspecified, unspecified site: Principal | ICD-10-CM

## 2016-12-14 MED ORDER — OXYCODONE HCL 10 MG PO TABS: 10.0000 mg | ORAL_TABLET | Freq: Four times a day (QID) | ORAL | 0 refills | Status: DC | PRN

## 2016-12-14 MED ORDER — MORPHINE SULFATE ER 15 MG PO TBCR: EXTENDED_RELEASE_TABLET | ORAL | 0 refills | Status: DC

## 2016-12-14 NOTE — Telephone Encounter (Signed)
Approved renewal. Please print and have ready for Dr. Rogue Bussing to sign in am. Patient will not be able to pick up rx today.

## 2016-12-14 NOTE — Telephone Encounter (Signed)
Patient would like refill for MS Contin, Oxycodone. Please advise.

## 2016-12-14 NOTE — Telephone Encounter (Signed)
Notified patient that scipts would ready tomorrow after 10 AM

## 2017-01-13 ENCOUNTER — Other Ambulatory Visit: Payer: Self-pay | Admitting: *Deleted

## 2017-01-13 DIAGNOSIS — B20 Human immunodeficiency virus [HIV] disease: Secondary | ICD-10-CM

## 2017-01-13 DIAGNOSIS — C8590 Non-Hodgkin lymphoma, unspecified, unspecified site: Principal | ICD-10-CM

## 2017-01-13 MED ORDER — OXYCODONE HCL 10 MG PO TABS: 10.0000 mg | ORAL_TABLET | Freq: Four times a day (QID) | ORAL | 0 refills | Status: DC | PRN

## 2017-01-13 MED ORDER — MORPHINE SULFATE ER 15 MG PO TBCR: EXTENDED_RELEASE_TABLET | ORAL | 0 refills | Status: DC

## 2017-02-11 ENCOUNTER — Other Ambulatory Visit: Payer: Self-pay | Admitting: *Deleted

## 2017-02-11 DIAGNOSIS — C8590 Non-Hodgkin lymphoma, unspecified, unspecified site: Principal | ICD-10-CM

## 2017-02-11 DIAGNOSIS — B20 Human immunodeficiency virus [HIV] disease: Secondary | ICD-10-CM

## 2017-02-11 MED ORDER — MORPHINE SULFATE ER 15 MG PO TBCR: EXTENDED_RELEASE_TABLET | ORAL | 0 refills | Status: DC

## 2017-02-11 MED ORDER — OXYCODONE HCL 10 MG PO TABS: 10.0000 mg | ORAL_TABLET | Freq: Four times a day (QID) | ORAL | 0 refills | Status: DC | PRN

## 2017-03-07 ENCOUNTER — Inpatient Hospital Stay (HOSPITAL_BASED_OUTPATIENT_CLINIC_OR_DEPARTMENT_OTHER): Payer: Medicare Other | Admitting: Internal Medicine

## 2017-03-07 ENCOUNTER — Inpatient Hospital Stay: Payer: Medicare Other | Attending: Internal Medicine

## 2017-03-07 VITALS — BP 126/80 | HR 76 | Temp 97.4°F | Resp 16 | Wt 141.4 lb

## 2017-03-07 DIAGNOSIS — G8929 Other chronic pain: Secondary | ICD-10-CM | POA: Insufficient documentation

## 2017-03-07 DIAGNOSIS — C8590 Non-Hodgkin lymphoma, unspecified, unspecified site: Secondary | ICD-10-CM

## 2017-03-07 DIAGNOSIS — R634 Abnormal weight loss: Secondary | ICD-10-CM | POA: Diagnosis not present

## 2017-03-07 DIAGNOSIS — Z8572 Personal history of non-Hodgkin lymphomas: Secondary | ICD-10-CM | POA: Diagnosis not present

## 2017-03-07 DIAGNOSIS — B20 Human immunodeficiency virus [HIV] disease: Secondary | ICD-10-CM

## 2017-03-07 DIAGNOSIS — Z9221 Personal history of antineoplastic chemotherapy: Secondary | ICD-10-CM

## 2017-03-07 DIAGNOSIS — K629 Disease of anus and rectum, unspecified: Secondary | ICD-10-CM | POA: Insufficient documentation

## 2017-03-07 DIAGNOSIS — Z79899 Other long term (current) drug therapy: Secondary | ICD-10-CM | POA: Insufficient documentation

## 2017-03-07 DIAGNOSIS — Z7982 Long term (current) use of aspirin: Secondary | ICD-10-CM | POA: Diagnosis not present

## 2017-03-07 LAB — CBC WITH DIFFERENTIAL/PLATELET
Basophils Absolute: 0 10*3/uL (ref 0–0.1)
Basophils Relative: 1 %
Eosinophils Absolute: 0.1 10*3/uL (ref 0–0.7)
Eosinophils Relative: 1 %
HEMATOCRIT: 36.2 % — AB (ref 40.0–52.0)
HEMOGLOBIN: 13 g/dL (ref 13.0–18.0)
LYMPHS ABS: 2.1 10*3/uL (ref 1.0–3.6)
Lymphocytes Relative: 47 %
MCH: 39.3 pg — ABNORMAL HIGH (ref 26.0–34.0)
MCHC: 35.9 g/dL (ref 32.0–36.0)
MCV: 109.5 fL — ABNORMAL HIGH (ref 80.0–100.0)
MONOS PCT: 8 %
Monocytes Absolute: 0.3 10*3/uL (ref 0.2–1.0)
NEUTROS ABS: 1.9 10*3/uL (ref 1.4–6.5)
Neutrophils Relative %: 43 %
Platelets: 190 10*3/uL (ref 150–440)
RBC: 3.31 MIL/uL — AB (ref 4.40–5.90)
RDW: 16.1 % — ABNORMAL HIGH (ref 11.5–14.5)
WBC: 4.4 10*3/uL (ref 3.8–10.6)

## 2017-03-07 LAB — COMPREHENSIVE METABOLIC PANEL
ALK PHOS: 90 U/L (ref 38–126)
ALT: 14 U/L — AB (ref 17–63)
ANION GAP: 8 (ref 5–15)
AST: 20 U/L (ref 15–41)
Albumin: 4.4 g/dL (ref 3.5–5.0)
BILIRUBIN TOTAL: 0.8 mg/dL (ref 0.3–1.2)
BUN: 16 mg/dL (ref 6–20)
CO2: 27 mmol/L (ref 22–32)
CREATININE: 1.06 mg/dL (ref 0.61–1.24)
Calcium: 9.2 mg/dL (ref 8.9–10.3)
Chloride: 102 mmol/L (ref 101–111)
GFR calc Af Amer: >60 mL/min (ref >60)
GFR calc non Af Amer: >60 mL/min (ref >60)
Glucose, Bld: 109 mg/dL — ABNORMAL HIGH (ref 65–99)
Potassium: 3.4 mmol/L — ABNORMAL LOW (ref 3.5–5.1)
SODIUM: 137 mmol/L (ref 135–145)
TOTAL PROTEIN: 7.1 g/dL (ref 6.5–8.1)

## 2017-03-07 LAB — LACTATE DEHYDROGENASE: LDH: 98 U/L (ref 98–192)

## 2017-03-07 NOTE — Assessment & Plan Note (Addendum)
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 Discussed with the patient that if he continues to lose weight he needs to let us know he would need further imaging.  # AIDS- viral load undetectable-continue ART.   # Anal lesions/noninvasive squamous cell s/p resection at [facility] [sep 2016]  # weight loss- ~ 9 pounds/ sec to mom loss./social stress.   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 prescription for MS Contin; oxycodone. Will need refill again next month.   Patient will follow-up with me in approximately one year.

## 2017-03-07 NOTE — Progress Notes (Signed)
Patient here today for follow up.  Patient states recent loss of appetite due to mother passing last month.

## 2017-03-07 NOTE — Progress Notes (Signed)
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  Patient has lost some weight which he attributes to recent social stress/loss of his mother. Overall is doing well; denies any new lumps or bumps. Denies any night sweats or significant weight loss.  He does have chronic pain in his neck from previous neck surgery-and is on MS Contin/oxycodone for pain control.  REVIEW OF SYSTEMS:  A complete 10 point review of system is done which is negative except mentioned above/history of present illness.   PAST MEDICAL HISTORY :  Past Medical History:  Diagnosis Date  . AIDS (acquired immune deficiency syndrome) ([place])   . AIDS-related lymphoma ([place]) 03/04/2015  . Lesion of left lung May 2012    PAST SURGICAL HISTORY :  No past surgical history on file.  FAMILY HISTORY :  No family history on file.  SOCIAL HISTORY:   Social History  Substance Use Topics  . Smoking status: Not on file  . Smokeless tobacco: Not on file  . Alcohol use Not on file    ALLERGIES:  is allergic to abacavir; zalcitabine; and duloxetine hcl.  MEDICATIONS:  Current Outpatient Prescriptions  Medication Sig Dispense Refill  . albuterol (PROVENTIL HFA) 108 (90 BASE) MCG/ACT inhaler Inhale into the lungs.    . ALPRAZolam (XANAX) 1 MG tablet Take 1 mg by mouth.    Marland Kitchen aspirin EC 81 MG tablet Take 81 mg by mouth.    . fluconazole (DIFLUCAN) 200 MG tablet Take 200 mg by mouth. Reported on 0/35/0093    . folic  acid (FOLVITE) 818 MCG tablet Take 0.8 mg by mouth.    Marland Kitchen ibuprofen (ADVIL,MOTRIN) 200 MG tablet Take 400 mg by mouth.    Marland Kitchen ketoconazole (NIZORAL) 2 % cream Apply topically.    Marland Kitchen lamiVUDine-zidovudine (COMBIVIR) 150-300 MG tablet Take by mouth.    . levocetirizine (XYZAL) 5 MG tablet Take 5 mg by mouth.    . lopinavir-ritonavir (KALETRA) 200-50 MG tablet Take by mouth.    . meclizine (ANTIVERT) 25 MG tablet Take 25 mg by mouth.    . mirtazapine (REMERON) 30 MG tablet Take 60 mg by mouth.    . mometasone (NASONEX) 50 MCG/ACT nasal spray Frequency:QD   Dosage:50   MCG  Instructions:  Note:Dose: 50 MCG    . morphine (MS CONTIN) 15 MG 12 hr tablet Take 1 tablet q AM and 2 tablets q PM orally 90 tablet 0  . omeprazole (PRILOSEC) 40 MG capsule Take 40 mg by mouth.    . Oxycodone HCl 10 MG TABS Take 1 tablet (10 mg total) by mouth every 6 (six) hours as needed. 120 tablet 0  . promethazine (PHENERGAN) 25 MG tablet Take 25 mg by mouth.    . QUEtiapine (SEROQUEL XR) 200 MG 24 hr tablet Take 200 mg by mouth at bedtime.    Marland Kitchen tenofovir (VIREAD) 300 MG tablet Take 300 mg by mouth.    . loratadine (CLARITIN) 10 MG tablet  Take by mouth.     No current facility-administered medications for this visit.     PHYSICAL EXAMINATION: ECOG PERFORMANCE STATUS: 0 - Asymptomatic  BP 126/80 (BP Location: Left Arm, Patient Position: Sitting)   Pulse 76   Temp 97.4 F (36.3 C) (Tympanic)   Resp 16   Wt 141 lb 6 oz (64.1 kg)   BMI 18.65 kg/m   Filed Weights   03/07/17 1515  Weight: 141 lb 6 oz (64.1 kg)    GENERAL: Well-nourished well-developed; Alert, no distress and comfortable.   Accompanied by his partner. EYES: no pallor or icterus OROPHARYNX: no thrush or ulceration; good dentition  NECK: supple, no masses felt; right neck surgery incision noted. LYMPH:  no palpable lymphadenopathy in the cervical, axillary or inguinal regions LUNGS: clear to auscultation and  No wheeze or crackles HEART/CVS:  regular rate & rhythm and no murmurs; No lower extremity edema ABDOMEN:abdomen soft, non-tender and normal bowel sounds Musculoskeletal:no cyanosis of digits and no clubbing  PSYCH: alert & oriented x 3 with fluent speech NEURO: no focal motor/sensory deficits SKIN:  no rashes or significant lesions  LABORATORY DATA:  I have reviewed the data as listed    Component Value Date/Time   NA 137 03/07/2017 1440   NA 139 01/07/2014 1044   K 3.4 (L) 03/07/2017 1440   K 4.4 01/07/2014 1044   CL 102 03/07/2017 1440   CL 102 01/07/2014 1044   CO2 27 03/07/2017 1440   CO2 30 01/07/2014 1044   GLUCOSE 109 (H) 03/07/2017 1440   GLUCOSE 113 (H) 01/07/2014 1044   BUN 16 03/07/2017 1440   BUN 15 01/07/2014 1044   CREATININE 1.06 03/07/2017 1440   CREATININE 1.03 07/09/2014 1411   CALCIUM 9.2 03/07/2017 1440   CALCIUM 9.3 01/07/2014 1044   PROT 7.1 03/07/2017 1440   PROT 7.5 07/09/2014 1411   ALBUMIN 4.4 03/07/2017 1440   ALBUMIN 3.8 07/09/2014 1411   AST 20 03/07/2017 1440   AST 45 (H) 07/09/2014 1411   ALT 14 (L) 03/07/2017 1440   ALT 25 07/09/2014 1411   ALKPHOS 90 03/07/2017 1440   ALKPHOS 78 07/09/2014 1411   BILITOT 0.8 03/07/2017 1440   BILITOT 0.4 07/09/2014 1411   GFRNONAA >60 03/07/2017 1440   GFRNONAA >60 07/09/2014 1411   GFRNONAA >60 01/07/2014 1044   GFRAA >60 03/07/2017 1440   GFRAA >60 07/09/2014 1411   GFRAA >60 01/07/2014 1044    No results found for: SPEP, UPEP  Lab Results  Component Value Date   WBC 4.4 03/07/2017   NEUTROABS 1.9 03/07/2017   HGB 13.0 03/07/2017   HCT 36.2 (L) 03/07/2017   MCV 109.5 (H) 03/07/2017   PLT 190 03/07/2017      Chemistry      Component Value Date/Time   NA 137 03/07/2017 1440   NA 139 01/07/2014 1044   K 3.4 (L) 03/07/2017 1440   K 4.4 01/07/2014 1044   CL 102 03/07/2017 1440   CL 102 01/07/2014 1044   CO2 27 03/07/2017 1440   CO2 30 01/07/2014 1044   BUN 16 03/07/2017 1440   BUN 15 01/07/2014 1044   CREATININE  1.06 03/07/2017 1440   CREATININE 1.03 07/09/2014 1411      Component Value Date/Time   CALCIUM 9.2 03/07/2017 1440   CALCIUM 9.3 01/07/2014 1044   ALKPHOS 90 03/07/2017 1440   ALKPHOS 78 07/09/2014 1411   AST 20 03/07/2017 1440   AST 45 (H) 07/09/2014 1411  ALT 14 (L) 03/07/2017 1440   ALT 25 07/09/2014 1411   BILITOT 0.8 03/07/2017 1440   BILITOT 0.4 07/09/2014 1411       RADIOGRAPHIC STUDIES: I have personally reviewed the radiological images as listed and agreed with the findings in the report. No results found.   ASSESSMENT & PLAN:   AIDS-related lymphoma # AIDS related DLBCL Stage II s/p R-CHOP [2006]- clinically no evidence of recurrence noted. Discussed with the patient that if he continues to lose weight he needs to let us know he would need further imaging.  # AIDS- viral load undetectable-continue ART.   # Anal lesions/noninvasive squamous cell s/p resection at [facility] [sep 2016]  # weight loss- ~ 9 pounds/ sec to mom loss./social stress.   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 prescription for MS Contin; oxycodone. Will need refill again next month.   Patient will follow-up with me in approximately one year.    Orders Placed This Encounter  Procedures  . CBC with Differential/Platelet    Standing Status:   Future    Standing Expiration Date:   09/06/2018  . Comprehensive metabolic panel    Standing Status:   Future    Standing Expiration Date:   09/06/2018   All questions were answered. The patient knows to call the clinic with any problems, questions or concerns. No barriers to learning was detected.      Cammie Sickle, MD 03/07/2017 7:36 PM

## 2017-03-14 ENCOUNTER — Other Ambulatory Visit: Payer: Self-pay | Admitting: *Deleted

## 2017-03-14 DIAGNOSIS — C8590 Non-Hodgkin lymphoma, unspecified, unspecified site: Principal | ICD-10-CM

## 2017-03-14 DIAGNOSIS — B20 Human immunodeficiency virus [HIV] disease: Secondary | ICD-10-CM

## 2017-03-14 MED ORDER — MORPHINE SULFATE ER 15 MG PO TBCR: EXTENDED_RELEASE_TABLET | ORAL | 0 refills | Status: DC

## 2017-03-14 MED ORDER — OXYCODONE HCL 10 MG PO TABS: 10.0000 mg | ORAL_TABLET | Freq: Four times a day (QID) | ORAL | 0 refills | Status: DC | PRN

## 2017-04-13 ENCOUNTER — Other Ambulatory Visit: Payer: Self-pay | Admitting: *Deleted

## 2017-04-13 ENCOUNTER — Telehealth: Payer: Self-pay | Admitting: *Deleted

## 2017-04-13 DIAGNOSIS — Z8572 Personal history of non-Hodgkin lymphomas: Secondary | ICD-10-CM | POA: Diagnosis not present

## 2017-04-13 DIAGNOSIS — B20 Human immunodeficiency virus [HIV] disease: Secondary | ICD-10-CM

## 2017-04-13 DIAGNOSIS — C8590 Non-Hodgkin lymphoma, unspecified, unspecified site: Principal | ICD-10-CM

## 2017-04-13 DIAGNOSIS — Z0000 Encounter for general adult medical examination without abnormal findings: Secondary | ICD-10-CM | POA: Diagnosis not present

## 2017-04-13 DIAGNOSIS — R202 Paresthesia of skin: Secondary | ICD-10-CM | POA: Diagnosis not present

## 2017-04-13 MED ORDER — OXYCODONE HCL 10 MG PO TABS: 10.0000 mg | ORAL_TABLET | Freq: Four times a day (QID) | ORAL | 0 refills | Status: DC | PRN

## 2017-04-13 MED ORDER — MORPHINE SULFATE ER 15 MG PO TBCR: EXTENDED_RELEASE_TABLET | ORAL | 0 refills | Status: DC

## 2017-04-13 NOTE — Telephone Encounter (Signed)
Patient called requesting refill for pain medications writer informed patient that prescriptions were at the front desk.

## 2017-04-26 MED ORDER — QUETIAPINE 200 MG TABLET
ORAL_TABLET | 0 refills | 0 days | Status: CP
Start: 2017-04-26 — End: 2017-06-14

## 2017-05-06 MED ORDER — MIRTAZAPINE 30 MG TABLET
ORAL_TABLET | Freq: Every evening | ORAL | 0 refills | 0.00000 days | Status: CP
Start: 2017-05-06 — End: 2017-06-14

## 2017-05-17 ENCOUNTER — Other Ambulatory Visit: Payer: Self-pay | Admitting: *Deleted

## 2017-05-17 DIAGNOSIS — B20 Human immunodeficiency virus [HIV] disease: Secondary | ICD-10-CM

## 2017-05-17 DIAGNOSIS — C8590 Non-Hodgkin lymphoma, unspecified, unspecified site: Principal | ICD-10-CM

## 2017-05-17 MED ORDER — MORPHINE SULFATE ER 15 MG PO TBCR: EXTENDED_RELEASE_TABLET | ORAL | 0 refills | Status: DC

## 2017-05-17 MED ORDER — OXYCODONE HCL 10 MG PO TABS: 10.0000 mg | ORAL_TABLET | Freq: Four times a day (QID) | ORAL | 0 refills | Status: DC | PRN

## 2017-05-30 DIAGNOSIS — H524 Presbyopia: Secondary | ICD-10-CM | POA: Diagnosis not present

## 2017-06-14 ENCOUNTER — Ambulatory Visit: Admission: RE | Admit: 2017-06-14 | Discharge: 2017-06-14 | Admitting: Psychiatry

## 2017-06-14 DIAGNOSIS — F419 Anxiety disorder, unspecified: Secondary | ICD-10-CM

## 2017-06-14 DIAGNOSIS — F3342 Major depressive disorder, recurrent, in full remission: Principal | ICD-10-CM

## 2017-06-14 DIAGNOSIS — F4321 Adjustment disorder with depressed mood: Secondary | ICD-10-CM

## 2017-06-14 MED ORDER — ALPRAZOLAM 1 MG TABLET
ORAL_TABLET | Freq: Four times a day (QID) | ORAL | 2 refills | 0 days | Status: CP
Start: 2017-06-14 — End: 2017-08-16

## 2017-06-14 MED ORDER — QUETIAPINE 200 MG TABLET
ORAL_TABLET | Freq: Every evening | ORAL | 0 refills | 0.00000 days | Status: CP
Start: 2017-06-14 — End: 2017-08-16

## 2017-06-14 MED ORDER — MIRTAZAPINE 30 MG TABLET
ORAL_TABLET | Freq: Every evening | ORAL | 0 refills | 0.00000 days | Status: CP
Start: 2017-06-14 — End: 2017-08-16

## 2017-06-16 ENCOUNTER — Other Ambulatory Visit: Payer: Self-pay | Admitting: *Deleted

## 2017-06-16 DIAGNOSIS — B20 Human immunodeficiency virus [HIV] disease: Secondary | ICD-10-CM

## 2017-06-16 DIAGNOSIS — C8590 Non-Hodgkin lymphoma, unspecified, unspecified site: Principal | ICD-10-CM

## 2017-06-16 MED ORDER — MORPHINE SULFATE ER 15 MG PO TBCR: EXTENDED_RELEASE_TABLET | ORAL | 0 refills | Status: DC

## 2017-06-16 MED ORDER — OXYCODONE HCL 10 MG PO TABS: 10.0000 mg | ORAL_TABLET | Freq: Four times a day (QID) | ORAL | 0 refills | Status: DC | PRN

## 2017-07-14 MED ORDER — ALPRAZOLAM 1 MG TABLET
ORAL_TABLET | 2 refills | 0 days | Status: CP
Start: 2017-07-14 — End: 2017-08-16

## 2017-07-15 ENCOUNTER — Other Ambulatory Visit: Payer: Self-pay | Admitting: *Deleted

## 2017-07-15 DIAGNOSIS — B20 Human immunodeficiency virus [HIV] disease: Secondary | ICD-10-CM

## 2017-07-15 DIAGNOSIS — C8590 Non-Hodgkin lymphoma, unspecified, unspecified site: Principal | ICD-10-CM

## 2017-07-15 MED ORDER — OXYCODONE HCL 10 MG PO TABS: 10.0000 mg | ORAL_TABLET | Freq: Four times a day (QID) | ORAL | 0 refills | Status: DC | PRN

## 2017-07-15 MED ORDER — MORPHINE SULFATE ER 15 MG PO TBCR: EXTENDED_RELEASE_TABLET | ORAL | 0 refills | Status: DC

## 2017-07-19 MED ORDER — QUETIAPINE 200 MG TABLET
ORAL_TABLET | 0 refills | 0 days | Status: CP
Start: 2017-07-19 — End: 2017-08-16

## 2017-08-15 ENCOUNTER — Other Ambulatory Visit: Payer: Self-pay | Admitting: *Deleted

## 2017-08-15 DIAGNOSIS — C8590 Non-Hodgkin lymphoma, unspecified, unspecified site: Principal | ICD-10-CM

## 2017-08-15 DIAGNOSIS — B20 Human immunodeficiency virus [HIV] disease: Secondary | ICD-10-CM

## 2017-08-15 MED ORDER — MORPHINE SULFATE ER 15 MG PO TBCR: EXTENDED_RELEASE_TABLET | ORAL | 0 refills | Status: DC

## 2017-08-15 MED ORDER — OXYCODONE HCL 10 MG PO TABS: 10.0000 mg | ORAL_TABLET | Freq: Four times a day (QID) | ORAL | 0 refills | Status: DC | PRN

## 2017-08-15 NOTE — Telephone Encounter (Signed)
Informed that prescription is ready to pick up  

## 2017-08-16 ENCOUNTER — Ambulatory Visit: Admission: RE | Admit: 2017-08-16 | Discharge: 2017-08-16 | Admitting: Psychiatry

## 2017-08-16 DIAGNOSIS — F3342 Major depressive disorder, recurrent, in full remission: Principal | ICD-10-CM

## 2017-08-16 DIAGNOSIS — F419 Anxiety disorder, unspecified: Secondary | ICD-10-CM

## 2017-08-16 DIAGNOSIS — Z634 Disappearance and death of family member: Secondary | ICD-10-CM

## 2017-08-16 MED ORDER — ALPRAZOLAM 1 MG TABLET
ORAL_TABLET | Freq: Four times a day (QID) | ORAL | 2 refills | 0 days | Status: CP
Start: 2017-08-16 — End: 2017-10-18

## 2017-08-16 MED ORDER — QUETIAPINE 200 MG TABLET
ORAL_TABLET | Freq: Every evening | ORAL | 0 refills | 0 days | Status: CP
Start: 2017-08-16 — End: 2017-10-18

## 2017-08-16 MED ORDER — MIRTAZAPINE 30 MG TABLET
ORAL_TABLET | Freq: Every evening | ORAL | 0 refills | 0 days | Status: CP
Start: 2017-08-16 — End: 2017-10-18

## 2017-09-14 ENCOUNTER — Other Ambulatory Visit: Payer: Self-pay | Admitting: *Deleted

## 2017-09-14 DIAGNOSIS — B20 Human immunodeficiency virus [HIV] disease: Secondary | ICD-10-CM

## 2017-09-14 DIAGNOSIS — C8590 Non-Hodgkin lymphoma, unspecified, unspecified site: Principal | ICD-10-CM

## 2017-09-14 MED ORDER — MORPHINE SULFATE ER 15 MG PO TBCR: EXTENDED_RELEASE_TABLET | ORAL | 0 refills | Status: DC

## 2017-09-14 MED ORDER — OXYCODONE HCL 10 MG PO TABS: 10.0000 mg | ORAL_TABLET | Freq: Four times a day (QID) | ORAL | 0 refills | Status: DC | PRN

## 2017-10-14 ENCOUNTER — Other Ambulatory Visit: Payer: Self-pay | Admitting: *Deleted

## 2017-10-14 DIAGNOSIS — B20 Human immunodeficiency virus [HIV] disease: Secondary | ICD-10-CM

## 2017-10-14 DIAGNOSIS — C8590 Non-Hodgkin lymphoma, unspecified, unspecified site: Principal | ICD-10-CM

## 2017-10-14 MED ORDER — MORPHINE SULFATE ER 15 MG PO TBCR: EXTENDED_RELEASE_TABLET | ORAL | 0 refills | Status: DC

## 2017-10-14 MED ORDER — OXYCODONE HCL 10 MG PO TABS: 10.0000 mg | ORAL_TABLET | Freq: Four times a day (QID) | ORAL | 0 refills | Status: DC | PRN

## 2017-10-17 DIAGNOSIS — E782 Mixed hyperlipidemia: Secondary | ICD-10-CM | POA: Diagnosis not present

## 2017-10-17 DIAGNOSIS — I10 Essential (primary) hypertension: Secondary | ICD-10-CM | POA: Diagnosis not present

## 2017-10-17 DIAGNOSIS — L989 Disorder of the skin and subcutaneous tissue, unspecified: Secondary | ICD-10-CM | POA: Diagnosis not present

## 2017-10-18 ENCOUNTER — Encounter: Admit: 2017-10-18 | Discharge: 2017-10-19 | Payer: MEDICARE

## 2017-10-18 DIAGNOSIS — F3342 Major depressive disorder, recurrent, in full remission: Principal | ICD-10-CM

## 2017-10-18 MED ORDER — ALPRAZOLAM 1 MG TABLET
ORAL_TABLET | Freq: Four times a day (QID) | ORAL | 2 refills | 0 days | Status: CP
Start: 2017-10-18 — End: 2018-01-03

## 2017-10-18 MED ORDER — MIRTAZAPINE 30 MG TABLET
ORAL_TABLET | Freq: Every evening | ORAL | 0 refills | 0 days | Status: CP
Start: 2017-10-18 — End: 2018-01-03

## 2017-10-18 MED ORDER — QUETIAPINE 200 MG TABLET
ORAL_TABLET | Freq: Every evening | ORAL | 0 refills | 0.00000 days | Status: CP
Start: 2017-10-18 — End: 2018-01-03

## 2017-10-31 DIAGNOSIS — L578 Other skin changes due to chronic exposure to nonionizing radiation: Secondary | ICD-10-CM | POA: Diagnosis not present

## 2017-10-31 DIAGNOSIS — L820 Inflamed seborrheic keratosis: Secondary | ICD-10-CM | POA: Diagnosis not present

## 2017-10-31 DIAGNOSIS — R208 Other disturbances of skin sensation: Secondary | ICD-10-CM | POA: Diagnosis not present

## 2017-11-15 ENCOUNTER — Other Ambulatory Visit: Payer: Self-pay | Admitting: *Deleted

## 2017-11-15 DIAGNOSIS — C8590 Non-Hodgkin lymphoma, unspecified, unspecified site: Principal | ICD-10-CM

## 2017-11-15 DIAGNOSIS — B20 Human immunodeficiency virus [HIV] disease: Secondary | ICD-10-CM

## 2017-11-15 MED ORDER — OXYCODONE HCL 10 MG PO TABS: 10.0000 mg | ORAL_TABLET | Freq: Four times a day (QID) | ORAL | 0 refills | Status: DC | PRN

## 2017-11-15 MED ORDER — MORPHINE SULFATE ER 15 MG PO TBCR: EXTENDED_RELEASE_TABLET | ORAL | 0 refills | Status: DC

## 2017-11-15 NOTE — Telephone Encounter (Signed)
[facility] called [facility] requesting refill of Oxycodone and MS Contin.   As mandated by the [place] STOP Act (Strengthen Opioid Misuse Prevention), the [place] Controlled Substance Reporting System ([place]) was reviewed for this patient.  Below is the past 38-months of controlled substance prescriptions as displayed by the registry.  I have personally consulted with my supervising physician, Dr. Rogue Bussing, who agrees that continuation of opiate therapy is medically appropriate at this time and agrees to provide continual monitoring as indicated. Prescription sent electronically using [employer] secure transmission to requested pharmacy.   [place] Reviewed:     Beckey Rutter, [place], AGNP-C [place] at [facility] [phone] (office) 11/15/17 10:35 AM

## 2017-12-16 ENCOUNTER — Other Ambulatory Visit: Payer: Self-pay | Admitting: *Deleted

## 2017-12-16 DIAGNOSIS — B20 Human immunodeficiency virus [HIV] disease: Secondary | ICD-10-CM

## 2017-12-16 DIAGNOSIS — C8590 Non-Hodgkin lymphoma, unspecified, unspecified site: Principal | ICD-10-CM

## 2017-12-16 MED ORDER — MORPHINE SULFATE ER 15 MG PO TBCR: EXTENDED_RELEASE_TABLET | ORAL | 0 refills | Status: DC

## 2017-12-16 MED ORDER — OXYCODONE HCL 10 MG PO TABS: 10.0000 mg | ORAL_TABLET | Freq: Four times a day (QID) | ORAL | 0 refills | Status: DC | PRN

## 2018-01-03 ENCOUNTER — Ambulatory Visit: Admit: 2018-01-03 | Discharge: 2018-01-04 | Payer: MEDICARE

## 2018-01-03 DIAGNOSIS — F3342 Major depressive disorder, recurrent, in full remission: Principal | ICD-10-CM

## 2018-01-03 DIAGNOSIS — F419 Anxiety disorder, unspecified: Secondary | ICD-10-CM

## 2018-01-03 MED ORDER — QUETIAPINE 200 MG TABLET
ORAL_TABLET | Freq: Every evening | ORAL | 0 refills | 0.00000 days | Status: CP
Start: 2018-01-03 — End: 2018-02-27

## 2018-01-03 MED ORDER — ALPRAZOLAM 1 MG TABLET
ORAL_TABLET | Freq: Four times a day (QID) | ORAL | 2 refills | 0 days | Status: CP
Start: 2018-01-03 — End: 2018-02-27

## 2018-01-03 MED ORDER — MIRTAZAPINE 30 MG TABLET
ORAL_TABLET | Freq: Every evening | ORAL | 0 refills | 0 days | Status: CP
Start: 2018-01-03 — End: 2018-02-27

## 2018-01-16 ENCOUNTER — Other Ambulatory Visit: Payer: Self-pay | Admitting: *Deleted

## 2018-01-16 DIAGNOSIS — B20 Human immunodeficiency virus [HIV] disease: Secondary | ICD-10-CM

## 2018-01-16 DIAGNOSIS — C8590 Non-Hodgkin lymphoma, unspecified, unspecified site: Principal | ICD-10-CM

## 2018-01-16 MED ORDER — OXYCODONE HCL 10 MG PO TABS: 10.0000 mg | ORAL_TABLET | Freq: Four times a day (QID) | ORAL | 0 refills | Status: DC | PRN

## 2018-01-16 MED ORDER — MORPHINE SULFATE ER 15 MG PO TBCR: EXTENDED_RELEASE_TABLET | ORAL | 0 refills | Status: DC

## 2018-01-30 DIAGNOSIS — L720 Epidermal cyst: Secondary | ICD-10-CM | POA: Diagnosis not present

## 2018-01-30 DIAGNOSIS — L578 Other skin changes due to chronic exposure to nonionizing radiation: Secondary | ICD-10-CM | POA: Diagnosis not present

## 2018-01-30 DIAGNOSIS — L570 Actinic keratosis: Secondary | ICD-10-CM | POA: Diagnosis not present

## 2018-01-30 DIAGNOSIS — L820 Inflamed seborrheic keratosis: Secondary | ICD-10-CM | POA: Diagnosis not present

## 2018-02-15 ENCOUNTER — Other Ambulatory Visit: Payer: Self-pay | Admitting: *Deleted

## 2018-02-15 DIAGNOSIS — C8590 Non-Hodgkin lymphoma, unspecified, unspecified site: Principal | ICD-10-CM

## 2018-02-15 DIAGNOSIS — B20 Human immunodeficiency virus [HIV] disease: Secondary | ICD-10-CM

## 2018-02-15 MED ORDER — OXYCODONE HCL 10 MG PO TABS: 10.0000 mg | ORAL_TABLET | Freq: Four times a day (QID) | ORAL | 0 refills | Status: DC | PRN

## 2018-02-15 MED ORDER — MORPHINE SULFATE ER 15 MG PO TBCR: EXTENDED_RELEASE_TABLET | ORAL | 0 refills | Status: DC

## 2018-02-24 DIAGNOSIS — Z1211 Encounter for screening for malignant neoplasm of colon: Secondary | ICD-10-CM | POA: Diagnosis not present

## 2018-02-24 DIAGNOSIS — E782 Mixed hyperlipidemia: Secondary | ICD-10-CM | POA: Diagnosis not present

## 2018-02-24 DIAGNOSIS — C8590 Non-Hodgkin lymphoma, unspecified, unspecified site: Secondary | ICD-10-CM | POA: Diagnosis not present

## 2018-02-24 DIAGNOSIS — I10 Essential (primary) hypertension: Secondary | ICD-10-CM | POA: Diagnosis not present

## 2018-02-27 MED ORDER — MIRTAZAPINE 30 MG TABLET
ORAL_TABLET | Freq: Every evening | ORAL | 0 refills | 0.00000 days | Status: CP
Start: 2018-02-27 — End: 2018-03-28

## 2018-02-27 MED ORDER — QUETIAPINE 200 MG TABLET
ORAL_TABLET | Freq: Every evening | ORAL | 0 refills | 0 days | Status: CP
Start: 2018-02-27 — End: 2018-03-28

## 2018-02-27 MED ORDER — ALPRAZOLAM 1 MG TABLET
ORAL_TABLET | Freq: Four times a day (QID) | ORAL | 0 refills | 0 days | Status: CP
Start: 2018-02-27 — End: 2018-03-28

## 2018-03-07 ENCOUNTER — Inpatient Hospital Stay: Payer: Medicare Other | Attending: Internal Medicine

## 2018-03-07 ENCOUNTER — Inpatient Hospital Stay (HOSPITAL_BASED_OUTPATIENT_CLINIC_OR_DEPARTMENT_OTHER): Payer: Medicare Other | Admitting: Internal Medicine

## 2018-03-07 ENCOUNTER — Encounter: Payer: Self-pay | Admitting: Internal Medicine

## 2018-03-07 ENCOUNTER — Other Ambulatory Visit: Payer: Self-pay

## 2018-03-07 VITALS — BP 138/84 | HR 64 | Temp 97.6°F | Resp 20 | Ht 73.0 in | Wt 149.9 lb

## 2018-03-07 DIAGNOSIS — G8929 Other chronic pain: Secondary | ICD-10-CM

## 2018-03-07 DIAGNOSIS — Z79899 Other long term (current) drug therapy: Secondary | ICD-10-CM | POA: Insufficient documentation

## 2018-03-07 DIAGNOSIS — B20 Human immunodeficiency virus [HIV] disease: Secondary | ICD-10-CM | POA: Diagnosis present

## 2018-03-07 DIAGNOSIS — Z8572 Personal history of non-Hodgkin lymphomas: Secondary | ICD-10-CM | POA: Diagnosis not present

## 2018-03-07 DIAGNOSIS — C8590 Non-Hodgkin lymphoma, unspecified, unspecified site: Principal | ICD-10-CM

## 2018-03-07 DIAGNOSIS — M542 Cervicalgia: Secondary | ICD-10-CM | POA: Insufficient documentation

## 2018-03-07 LAB — CBC WITH DIFFERENTIAL/PLATELET
BASOS ABS: 0 10*3/uL (ref 0–0.1)
Basophils Relative: 1 %
Eosinophils Absolute: 0.1 10*3/uL (ref 0–0.7)
Eosinophils Relative: 3 %
HCT: 35.7 % — ABNORMAL LOW (ref 40.0–52.0)
HEMOGLOBIN: 12.5 g/dL — AB (ref 13.0–18.0)
LYMPHS ABS: 2.4 10*3/uL (ref 1.0–3.6)
LYMPHS PCT: 50 %
MCH: 40 pg — AB (ref 26.0–34.0)
MCHC: 35.1 g/dL (ref 32.0–36.0)
MCV: 114.2 fL — AB (ref 80.0–100.0)
Monocytes Absolute: 0.5 10*3/uL (ref 0.2–1.0)
Monocytes Relative: 10 %
NEUTROS ABS: 1.7 10*3/uL (ref 1.4–6.5)
NEUTROS PCT: 36 %
Platelets: 196 10*3/uL (ref 150–440)
RBC: 3.12 MIL/uL — AB (ref 4.40–5.90)
RDW: 12.7 % (ref 11.5–14.5)
WBC: 4.8 10*3/uL (ref 3.8–10.6)

## 2018-03-07 LAB — COMPREHENSIVE METABOLIC PANEL
ALT: 21 U/L (ref 0–44)
ANION GAP: 10 (ref 5–15)
AST: 23 U/L (ref 15–41)
Albumin: 4.5 g/dL (ref 3.5–5.0)
Alkaline Phosphatase: 82 U/L (ref 38–126)
BUN: 22 mg/dL — AB (ref 6–20)
CHLORIDE: 102 mmol/L (ref 98–111)
CO2: 25 mmol/L (ref 22–32)
Calcium: 9.3 mg/dL (ref 8.9–10.3)
Creatinine, Ser: 1.34 mg/dL — ABNORMAL HIGH (ref 0.61–1.24)
GFR, EST NON AFRICAN AMERICAN: 58 mL/min — AB (ref >60)
Glucose, Bld: 109 mg/dL — ABNORMAL HIGH (ref 70–99)
Potassium: 3.9 mmol/L (ref 3.5–5.1)
Sodium: 137 mmol/L (ref 135–145)
TOTAL PROTEIN: 7.3 g/dL (ref 6.5–8.1)
Total Bilirubin: 0.7 mg/dL (ref 0.3–1.2)

## 2018-03-07 LAB — LACTATE DEHYDROGENASE: LDH: 138 U/L (ref 98–192)

## 2018-03-07 NOTE — Progress Notes (Signed)
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  No new lumps or bumps.  Patient is gaining weight.   He states his HIV is under good control.  He continues to follow with ID.  He is awaiting to see a new ID doctor.  Review of Systems  Constitutional: Negative for chills, diaphoresis, fever, malaise/fatigue and weight loss.  HENT: Negative for nosebleeds and sore throat.   Eyes: Negative for double vision.  Respiratory: Negative for cough, hemoptysis, sputum production, shortness of breath and wheezing.   Cardiovascular: Negative for chest pain, palpitations, orthopnea and leg swelling.  Gastrointestinal: Negative for abdominal pain, blood in stool, constipation, diarrhea, heartburn, melena, nausea and vomiting.  Genitourinary: Negative for dysuria, frequency and urgency.  Musculoskeletal: Positive for neck pain. Negative for back pain and joint pain.  Skin: Negative.  Negative for itching and rash.  Neurological: Negative for dizziness, tingling, focal weakness, weakness and headaches.  Endo/Heme/Allergies: Does not bruise/bleed easily.  Psychiatric/Behavioral: Negative for depression. The patient is not nervous/anxious and does not have insomnia.       PAST MEDICAL HISTORY :  Past Medical History:  Diagnosis Date  . AIDS (acquired  immune deficiency syndrome) ([place])   . 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 Lesion of left lung May 2012    PAST SURGICAL HISTORY :  History reviewed. No pertinent surgical history.  FAMILY HISTORY :  History reviewed. No pertinent family history.  SOCIAL HISTORY:   Social History   Tobacco Use  . Smoking status: Not on file  Substance Use Topics  . Alcohol use: Not on file  . Drug use: Not on file    ALLERGIES:  is allergic to abacavir; zalcitabine; duloxetine hcl; and duloxetine.  MEDICATIONS:  Current Outpatient Medications  Medication Sig Dispense Refill  . albuterol (PROVENTIL HFA) 108 (90 BASE) MCG/ACT inhaler Inhale 1-2 puffs into the lungs every 6 (six) hours as needed for shortness of breath.     . ALPRAZolam (XANAX) 1 MG tablet Take 1 mg by mouth.    . folic acid (FOLVITE) 993 MCG tablet Take 0.8 mg by mouth.    Marland Kitchen ibuprofen (ADVIL,MOTRIN) 200 MG tablet Take 400 mg by mouth.    Marland Kitchen ketoconazole (NIZORAL) 2 % cream Apply topically.    Marland Kitchen lamiVUDine-zidovudine (COMBIVIR) 150-300 MG tablet Take by mouth.    . levocetirizine (XYZAL) 5 MG tablet Take 5 mg by mouth.    . lopinavir-ritonavir (KALETRA) 200-50 MG tablet Take 2 tablets by mouth 2 (two) times daily with a meal.     . meclizine (ANTIVERT) 25 MG tablet Take 25 mg by mouth.    . mirtazapine (REMERON) 30 MG tablet Take 60 mg by mouth.    . mometasone (NASONEX) 50 MCG/ACT nasal spray Frequency:QD   Dosage:50   MCG  Instructions:  Note:Dose: 50  MCG    . morphine (MS CONTIN) 15 MG 12 hr tablet Take 1 tablet q AM and 2 tablets q PM orally 90 tablet 0  . omeprazole (PRILOSEC) 40 MG capsule Take 40 mg by mouth.    . Oxycodone HCl 10 MG TABS Take 1 tablet (10 mg total) by mouth every 6 (six) hours as needed. 120 tablet 0  . QUEtiapine (SEROQUEL XR) 200 MG 24 hr tablet Take 200 mg by mouth at bedtime.    Marland Kitchen tenofovir (VIREAD) 300 MG tablet Take 300 mg by mouth.    . fluconazole (DIFLUCAN) 200 MG tablet Take 200 mg by mouth.  Reported on 03/05/2016    . promethazine (PHENERGAN) 25 MG tablet Take 25 mg by mouth.     No current facility-administered medications for this visit.     PHYSICAL EXAMINATION: ECOG PERFORMANCE STATUS: 1 - Symptomatic but completely ambulatory  BP 138/84   Pulse 64   Temp 97.6 F (36.4 C) (Tympanic)   Resp 20   Ht 6\' 1"  (1.854 m)   Wt 149 lb 14.6 oz (68 kg)   BMI 19.78 kg/m   Filed Weights   03/07/18 1356  Weight: 149 lb 14.6 oz (68 kg)    GENERAL: Thin built male patient; Alert, no distress and comfortable.  Alone.Marland Kitchen  EYES: no pallor or icterus OROPHARYNX: no thrush or ulceration; NECK: supple; no lymph nodes felt. LYMPH:  no palpable lymphadenopathy in the axillary or inguinal regions LUNGS: Decreased breath sounds auscultation bilaterally. No wheeze or crackles HEART/CVS: regular rate & rhythm and no murmurs; No lower extremity edema ABDOMEN:abdomen soft, non-tender and normal bowel sounds. No hepatomegaly or splenomegaly.  Musculoskeletal:no cyanosis of digits and no clubbing  PSYCH: alert & oriented x 3 with fluent speech NEURO: no focal motor/sensory deficits SKIN:  no rashes or significant lesions    LABORATORY DATA:  I have reviewed the data as listed    Component Value Date/Time   NA 137 03/07/2018 1333   NA 139 01/07/2014 1044   K 3.9 03/07/2018 1333   K 4.4 01/07/2014 1044   CL 102 03/07/2018 1333   CL 102 01/07/2014 1044   CO2 25 03/07/2018 1333   CO2 30 01/07/2014 1044   GLUCOSE 109 (H) 03/07/2018 1333   GLUCOSE 113 (H) 01/07/2014 1044   BUN 22 (H) 03/07/2018 1333   BUN 15 01/07/2014 1044   CREATININE 1.34 (H) 03/07/2018 1333   CREATININE 1.03 07/09/2014 1411   CALCIUM 9.3 03/07/2018 1333   CALCIUM 9.3 01/07/2014 1044   PROT 7.3 03/07/2018 1333   PROT 7.5 07/09/2014 1411   ALBUMIN 4.5 03/07/2018 1333   ALBUMIN 3.8 07/09/2014 1411   AST 23 03/07/2018 1333   AST 45 (H) 07/09/2014 1411   ALT 21 03/07/2018 1333   ALT 25 07/09/2014 1411    ALKPHOS 82 03/07/2018 1333   ALKPHOS 78 07/09/2014 1411   BILITOT 0.7 03/07/2018 1333   BILITOT 0.4 07/09/2014 1411   GFRNONAA 58 (L) 03/07/2018 1333   GFRNONAA >60 07/09/2014 1411   GFRNONAA >60 01/07/2014 1044   GFRAA >60 03/07/2018 1333   GFRAA >60 07/09/2014 1411   GFRAA >60 01/07/2014 1044    No results found for: SPEP, UPEP  Lab Results  Component Value Date   WBC 4.8 03/07/2018   NEUTROABS 1.7 03/07/2018   HGB 12.5 (L) 03/07/2018   HCT 35.7 (L) 03/07/2018   MCV 114.2 (H) 03/07/2018   PLT 196 03/07/2018  Chemistry      Component Value Date/Time   NA 137 03/07/2018 1333   NA 139 01/07/2014 1044   K 3.9 03/07/2018 1333   K 4.4 01/07/2014 1044   CL 102 03/07/2018 1333   CL 102 01/07/2014 1044   CO2 25 03/07/2018 1333   CO2 30 01/07/2014 1044   BUN 22 (H) 03/07/2018 1333   BUN 15 01/07/2014 1044   CREATININE 1.34 (H) 03/07/2018 1333   CREATININE 1.03 07/09/2014 1411      Component Value Date/Time   CALCIUM 9.3 03/07/2018 1333   CALCIUM 9.3 01/07/2014 1044   ALKPHOS 82 03/07/2018 1333   ALKPHOS 78 07/09/2014 1411   AST 23 03/07/2018 1333   AST 45 (H) 07/09/2014 1411   ALT 21 03/07/2018 1333   ALT 25 07/09/2014 1411   BILITOT 0.7 03/07/2018 1333   BILITOT 0.4 07/09/2014 1411       RADIOGRAPHIC STUDIES: I have personally reviewed the radiological images as listed and agreed with the findings in the report. No results found.   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  Stable.  # AIDS- viral load undetectable-continue ART.  Stable as per patient;  continue follow-up with ID.  # Anal lesions/noninvasive squamous cell s/p resection at [facility] [sep 2016]; awaiting colonoscopy.   # Elevated createnine- 1.34/ recommend Increase fluid intakle.   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 prescription for MS Contin; oxycodone.  Had a long  discussion with the patient regarding de-escalation of his pain regimen/slow wean off.  Will defer to pain management.  Patient will follow-up with me in approximately one year;referral to [place] [chronic pain; ? De-esclation of pain medications]   Orders Placed This Encounter  Procedures  . Comprehensive metabolic panel    Standing Status:   Future    Standing Expiration Date:   09/07/2019  . CBC with Differential    Standing Status:   Future    Standing Expiration Date:   09/07/2019  . Lactate dehydrogenase    Standing Status:   Future    Number of Occurrences:   1    Standing Expiration Date:   03/07/2019  . Lactate dehydrogenase    Standing Status:   Future    Standing Expiration Date:   09/07/2019  . Ambulatory referral to Pain Clinic    Referral Priority:   Routine    Referral Type:   Consultation    Referral Reason:   Specialty Services Required    Referred to Provider:   Gillis Santa, MD    Requested Specialty:   Pain Medicine    Number of Visits Requested:   1   All questions were answered. The patient knows to call the clinic with any problems, questions or concerns.      Cammie Sickle, MD 03/12/2018 2:59 PM

## 2018-03-07 NOTE — Assessment & Plan Note (Addendum)
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  Stable.  # AIDS- viral load undetectable-continue ART.  Stable as per patient;  continue follow-up with ID.  # Anal lesions/noninvasive squamous cell s/p resection at [facility] [sep 2016]; awaiting colonoscopy.   # Elevated createnine- 1.34/ recommend Increase fluid intakle.   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 prescription for MS Contin; oxycodone.  Had a long discussion with the patient regarding de-escalation of his pain regimen/slow wean off.  Will defer to pain management.  Patient will follow-up with me in approximately one year;referral to [place] [chronic pain; ? De-esclation of pain medications]

## 2018-03-08 ENCOUNTER — Other Ambulatory Visit: Payer: Medicare Other

## 2018-03-08 ENCOUNTER — Ambulatory Visit: Payer: Medicare Other | Admitting: Internal Medicine

## 2018-03-17 ENCOUNTER — Other Ambulatory Visit: Payer: Self-pay | Admitting: *Deleted

## 2018-03-17 DIAGNOSIS — B20 Human immunodeficiency virus [HIV] disease: Secondary | ICD-10-CM

## 2018-03-17 DIAGNOSIS — C8590 Non-Hodgkin lymphoma, unspecified, unspecified site: Principal | ICD-10-CM

## 2018-03-17 MED ORDER — OXYCODONE HCL 10 MG PO TABS: 10.0000 mg | ORAL_TABLET | Freq: Four times a day (QID) | ORAL | 0 refills | Status: DC | PRN

## 2018-03-17 MED ORDER — MORPHINE SULFATE ER 15 MG PO TBCR: EXTENDED_RELEASE_TABLET | ORAL | 0 refills | Status: DC

## 2018-03-17 NOTE — Telephone Encounter (Signed)
Patient reports that he has not been seen by Pain Management yet and needs refills

## 2018-03-28 ENCOUNTER — Encounter: Admit: 2018-03-28 | Discharge: 2018-03-29 | Payer: MEDICARE

## 2018-03-28 DIAGNOSIS — F3342 Major depressive disorder, recurrent, in full remission: Secondary | ICD-10-CM

## 2018-03-28 DIAGNOSIS — Z5181 Encounter for therapeutic drug level monitoring: Principal | ICD-10-CM

## 2018-03-28 DIAGNOSIS — F4329 Adjustment disorder with other symptoms: Secondary | ICD-10-CM

## 2018-03-28 DIAGNOSIS — F419 Anxiety disorder, unspecified: Secondary | ICD-10-CM

## 2018-03-28 DIAGNOSIS — Z634 Disappearance and death of family member: Secondary | ICD-10-CM | POA: Diagnosis not present

## 2018-03-28 MED ORDER — MIRTAZAPINE 30 MG TABLET
ORAL_TABLET | Freq: Every evening | ORAL | 1 refills | 0 days | Status: CP
Start: 2018-03-28 — End: 2018-06-16

## 2018-03-28 MED ORDER — QUETIAPINE 200 MG TABLET
ORAL_TABLET | Freq: Every evening | ORAL | 1 refills | 0.00000 days | Status: CP
Start: 2018-03-28 — End: 2018-06-16

## 2018-03-28 MED ORDER — ALPRAZOLAM 1 MG TABLET
ORAL_TABLET | Freq: Four times a day (QID) | ORAL | 2 refills | 0 days | Status: CP
Start: 2018-03-28 — End: 2018-06-16

## 2018-04-06 DIAGNOSIS — D013 Carcinoma in situ of anus and anal canal: Secondary | ICD-10-CM | POA: Diagnosis not present

## 2018-04-06 DIAGNOSIS — C8590 Non-Hodgkin lymphoma, unspecified, unspecified site: Secondary | ICD-10-CM | POA: Diagnosis not present

## 2018-04-06 DIAGNOSIS — Z1211 Encounter for screening for malignant neoplasm of colon: Secondary | ICD-10-CM | POA: Diagnosis not present

## 2018-04-14 ENCOUNTER — Other Ambulatory Visit: Payer: Self-pay | Admitting: *Deleted

## 2018-04-14 DIAGNOSIS — C8590 Non-Hodgkin lymphoma, unspecified, unspecified site: Principal | ICD-10-CM

## 2018-04-14 DIAGNOSIS — B20 Human immunodeficiency virus [HIV] disease: Secondary | ICD-10-CM

## 2018-04-14 MED ORDER — MORPHINE SULFATE ER 15 MG PO TBCR: EXTENDED_RELEASE_TABLET | ORAL | 0 refills | Status: DC

## 2018-04-14 MED ORDER — OXYCODONE HCL 10 MG PO TABS: 10.0000 mg | ORAL_TABLET | Freq: Four times a day (QID) | ORAL | 0 refills | Status: DC | PRN

## 2018-05-16 ENCOUNTER — Other Ambulatory Visit: Payer: Self-pay | Admitting: *Deleted

## 2018-05-16 DIAGNOSIS — C8590 Non-Hodgkin lymphoma, unspecified, unspecified site: Principal | ICD-10-CM

## 2018-05-16 DIAGNOSIS — B20 Human immunodeficiency virus [HIV] disease: Secondary | ICD-10-CM

## 2018-05-16 MED ORDER — MORPHINE SULFATE ER 15 MG PO TBCR: EXTENDED_RELEASE_TABLET | ORAL | 0 refills | Status: DC

## 2018-05-16 MED ORDER — OXYCODONE HCL 10 MG PO TABS: 10.0000 mg | ORAL_TABLET | Freq: Four times a day (QID) | ORAL | 0 refills | Status: DC | PRN

## 2018-05-30 DIAGNOSIS — L720 Epidermal cyst: Secondary | ICD-10-CM | POA: Diagnosis not present

## 2018-05-30 DIAGNOSIS — D485 Neoplasm of uncertain behavior of skin: Secondary | ICD-10-CM | POA: Diagnosis not present

## 2018-06-06 DIAGNOSIS — L720 Epidermal cyst: Secondary | ICD-10-CM | POA: Diagnosis not present

## 2018-06-16 ENCOUNTER — Other Ambulatory Visit: Payer: Self-pay | Admitting: Oncology

## 2018-06-16 ENCOUNTER — Other Ambulatory Visit: Payer: Self-pay | Admitting: *Deleted

## 2018-06-16 DIAGNOSIS — B20 Human immunodeficiency virus [HIV] disease: Secondary | ICD-10-CM

## 2018-06-16 DIAGNOSIS — C8590 Non-Hodgkin lymphoma, unspecified, unspecified site: Principal | ICD-10-CM

## 2018-06-16 MED ORDER — ALPRAZOLAM 1 MG TABLET
ORAL_TABLET | Freq: Four times a day (QID) | ORAL | 0 refills | 0 days | Status: CP
Start: 2018-06-16 — End: 2018-07-04

## 2018-06-16 MED ORDER — QUETIAPINE 200 MG TABLET
ORAL_TABLET | Freq: Every evening | ORAL | 0 refills | 0.00000 days | Status: CP
Start: 2018-06-16 — End: 2018-07-04

## 2018-06-16 MED ORDER — MIRTAZAPINE 30 MG TABLET
ORAL_TABLET | Freq: Every evening | ORAL | 0 refills | 0 days | Status: CP
Start: 2018-06-16 — End: 2018-07-04

## 2018-06-16 MED ORDER — OXYCODONE HCL 10 MG PO TABS: 10.0000 mg | ORAL_TABLET | Freq: Four times a day (QID) | ORAL | 0 refills | Status: DC | PRN

## 2018-06-16 MED ORDER — MORPHINE SULFATE ER 15 MG PO TBCR: EXTENDED_RELEASE_TABLET | ORAL | 0 refills | Status: DC

## 2018-06-16 NOTE — Progress Notes (Signed)
Patient called cancer center requesting refill of Oxycodone 10 mg tablets q 6 hours and Morphine Sulfate 15 mg q 12 hours.   [place] Controlled Substance Reporting System reviewed and refill is appropriate on or after 06/16/18. Medication e-scribed to his pharmacy ([facility]) using [employer] 2-step verification process.    [place]CCS[place] reviewed:    Faythe Casa, NP 06/16/2018 3:27 PM [phone]

## 2018-06-27 ENCOUNTER — Encounter: Payer: Self-pay | Admitting: *Deleted

## 2018-06-28 ENCOUNTER — Ambulatory Visit: Payer: Medicare Other | Admitting: Anesthesiology

## 2018-06-28 ENCOUNTER — Ambulatory Visit
Admission: RE | Admit: 2018-06-28 | Discharge: 2018-06-28 | Disposition: A | Discharge status: Home / Self Care | Payer: Medicare Other | Source: Ambulatory Visit | Attending: Internal Medicine | Admitting: Internal Medicine

## 2018-06-28 ENCOUNTER — Other Ambulatory Visit: Payer: Self-pay

## 2018-06-28 ENCOUNTER — Encounter
Admission: RE | Disposition: A | Discharge status: Home / Self Care | Payer: Self-pay | Source: Ambulatory Visit | Attending: Internal Medicine

## 2018-06-28 ENCOUNTER — Encounter: Payer: Self-pay | Admitting: Student

## 2018-06-28 DIAGNOSIS — G629 Polyneuropathy, unspecified: Secondary | ICD-10-CM | POA: Diagnosis not present

## 2018-06-28 DIAGNOSIS — I10 Essential (primary) hypertension: Secondary | ICD-10-CM | POA: Insufficient documentation

## 2018-06-28 DIAGNOSIS — K641 Second degree hemorrhoids: Secondary | ICD-10-CM | POA: Insufficient documentation

## 2018-06-28 DIAGNOSIS — K6289 Other specified diseases of anus and rectum: Secondary | ICD-10-CM | POA: Diagnosis not present

## 2018-06-28 DIAGNOSIS — K219 Gastro-esophageal reflux disease without esophagitis: Secondary | ICD-10-CM | POA: Insufficient documentation

## 2018-06-28 DIAGNOSIS — B20 Human immunodeficiency virus [HIV] disease: Secondary | ICD-10-CM | POA: Diagnosis not present

## 2018-06-28 DIAGNOSIS — Z1211 Encounter for screening for malignant neoplasm of colon: Secondary | ICD-10-CM | POA: Diagnosis not present

## 2018-06-28 DIAGNOSIS — K644 Residual hemorrhoidal skin tags: Secondary | ICD-10-CM | POA: Diagnosis not present

## 2018-06-28 DIAGNOSIS — E785 Hyperlipidemia, unspecified: Secondary | ICD-10-CM | POA: Diagnosis not present

## 2018-06-28 DIAGNOSIS — F419 Anxiety disorder, unspecified: Secondary | ICD-10-CM | POA: Insufficient documentation

## 2018-06-28 DIAGNOSIS — Z7982 Long term (current) use of aspirin: Secondary | ICD-10-CM | POA: Insufficient documentation

## 2018-06-28 DIAGNOSIS — C189 Malignant neoplasm of colon, unspecified: Secondary | ICD-10-CM | POA: Diagnosis not present

## 2018-06-28 DIAGNOSIS — K648 Other hemorrhoids: Secondary | ICD-10-CM | POA: Diagnosis not present

## 2018-06-28 DIAGNOSIS — Z8572 Personal history of non-Hodgkin lymphomas: Secondary | ICD-10-CM | POA: Insufficient documentation

## 2018-06-28 DIAGNOSIS — F329 Major depressive disorder, single episode, unspecified: Secondary | ICD-10-CM | POA: Diagnosis not present

## 2018-06-28 DIAGNOSIS — Z79899 Other long term (current) drug therapy: Secondary | ICD-10-CM | POA: Diagnosis not present

## 2018-06-28 DIAGNOSIS — K640 First degree hemorrhoids: Secondary | ICD-10-CM | POA: Diagnosis not present

## 2018-06-28 HISTORY — DX: Major depressive disorder, single episode, unspecified: F32.9

## 2018-06-28 HISTORY — DX: Hyperlipidemia, unspecified: E78.5

## 2018-06-28 HISTORY — DX: Anxiety disorder, unspecified: F41.9

## 2018-06-28 HISTORY — DX: Depression, unspecified: F32.A

## 2018-06-28 HISTORY — DX: Polyneuropathy, unspecified: G62.9

## 2018-06-28 HISTORY — DX: Carcinoma in situ of anus and anal canal: D01.3

## 2018-06-28 HISTORY — DX: Gastro-esophageal reflux disease without esophagitis: K21.9

## 2018-06-28 HISTORY — DX: Pneumocystosis: B59

## 2018-06-28 HISTORY — DX: Human immunodeficiency virus (HIV) disease: B20

## 2018-06-28 HISTORY — PX: COLONOSCOPY WITH PROPOFOL: SHX5780

## 2018-06-28 HISTORY — DX: Asymptomatic human immunodeficiency virus (hiv) infection status: Z21

## 2018-06-28 HISTORY — DX: Cryptococcosis, unspecified: B45.9

## 2018-06-28 SURGERY — COLONOSCOPY WITH PROPOFOL
Anesthesia: General

## 2018-06-28 MED ORDER — PROPOFOL 500 MG/50ML IV EMUL
INTRAVENOUS | Status: DC | PRN
  Administered 2018-06-28: 300 ug/kg/min via INTRAVENOUS

## 2018-06-28 MED ORDER — PROPOFOL 10 MG/ML IV BOLUS
INTRAVENOUS | Status: AC
  Filled 2018-06-28: qty 40

## 2018-06-28 MED ORDER — PROPOFOL 10 MG/ML IV BOLUS
INTRAVENOUS | Status: DC | PRN
  Administered 2018-06-28: 60 mg via INTRAVENOUS
  Administered 2018-06-28: 100 mg via INTRAVENOUS

## 2018-06-28 MED ORDER — SODIUM CHLORIDE 0.9 % IV SOLN
INTRAVENOUS | Status: DC
  Administered 2018-06-28: 14:00:00 via INTRAVENOUS

## 2018-06-28 MED ORDER — MIDAZOLAM HCL 5 MG/5ML IJ SOLN
INTRAMUSCULAR | Status: DC | PRN
  Administered 2018-06-28: 2 mg via INTRAVENOUS

## 2018-06-28 MED ORDER — LIDOCAINE HCL (CARDIAC) PF 100 MG/5ML IV SOSY
PREFILLED_SYRINGE | INTRAVENOUS | Status: DC | PRN
  Administered 2018-06-28: 80 mg via INTRAVENOUS

## 2018-06-28 MED ORDER — MIDAZOLAM HCL 2 MG/2ML IJ SOLN
INTRAMUSCULAR | Status: AC
  Filled 2018-06-28: qty 2

## 2018-06-28 NOTE — Anesthesia Post-op Follow-up Note (Signed)
Anesthesia QCDR form completed.        

## 2018-06-28 NOTE — H&P (Signed)
Outpatient short stay form Pre-procedure 06/28/2018 12:17 PM Teodoro K. Alice Reichert, M.D.  Primary Physician: Charlaine Dalton, M.D.  Reason for visit:  Colon cancer screening.  History of present illness:  Patient is a pleasant 57 y/o male patient who is treated for HIV+ status, hx of lymphoma (in remission), who presents for colonoscopy for screening purposes. Patient denies change in bowel habits, rectal bleeding, weight loss or abdominal pain.    No current facility-administered medications for this encounter.   Current Outpatient Medications:  .  aspirin EC 81 MG tablet, Take 81 mg by mouth daily., Disp: , Rfl:  .  dronabinol (MARINOL) 10 MG capsule, Take 10 mg by mouth 2 (two) times daily before a meal., Disp: , Rfl:  .  albuterol (PROVENTIL HFA) 108 (90 BASE) MCG/ACT inhaler, Inhale 1-2 puffs into the lungs every 6 (six) hours as needed for shortness of breath. , Disp: , Rfl:  .  ALPRAZolam (XANAX) 1 MG tablet, Take 1 mg by mouth., Disp: , Rfl:  .  fluconazole (DIFLUCAN) 200 MG tablet, Take 200 mg by mouth. Reported on 03/05/2016, Disp: , Rfl:  .  folic acid (FOLVITE) 063 MCG tablet, Take 0.8 mg by mouth., Disp: , Rfl:  .  ibuprofen (ADVIL,MOTRIN) 200 MG tablet, Take 400 mg by mouth., Disp: , Rfl:  .  ketoconazole (NIZORAL) 2 % cream, Apply topically., Disp: , Rfl:  .  lamiVUDine-zidovudine (COMBIVIR) 150-300 MG tablet, Take by mouth., Disp: , Rfl:  .  levocetirizine (XYZAL) 5 MG tablet, Take 5 mg by mouth., Disp: , Rfl:  .  lopinavir-ritonavir (KALETRA) 200-50 MG tablet, Take 2 tablets by mouth 2 (two) times daily with a meal. , Disp: , Rfl:  .  meclizine (ANTIVERT) 25 MG tablet, Take 25 mg by mouth., Disp: , Rfl:  .  mirtazapine (REMERON) 30 MG tablet, Take 60 mg by mouth., Disp: , Rfl:  .  mometasone (NASONEX) 50 MCG/ACT nasal spray, Frequency:QD   Dosage:50   MCG  Instructions:  Note:Dose: 50 MCG, Disp: , Rfl:  .  morphine (MS CONTIN) 15 MG 12 hr tablet, Take 1 tablet q AM and 2  tablets q PM orally, Disp: 90 tablet, Rfl: 0 .  omeprazole (PRILOSEC) 40 MG capsule, Take 40 mg by mouth., Disp: , Rfl:  .  Oxycodone HCl 10 MG TABS, Take 1 tablet (10 mg total) by mouth every 6 (six) hours as needed., Disp: 120 tablet, Rfl: 0 .  promethazine (PHENERGAN) 25 MG tablet, Take 25 mg by mouth., Disp: , Rfl:  .  QUEtiapine (SEROQUEL XR) 200 MG 24 hr tablet, Take 200 mg by mouth at bedtime., Disp: , Rfl:  .  tenofovir (VIREAD) 300 MG tablet, Take 300 mg by mouth., Disp: , Rfl:   No medications prior to admission.     Allergies  Allergen Reactions  . Abacavir Nausea And Vomiting, Anaphylaxis and Rash  . Zalcitabine Anaphylaxis, Rash and Other (See Comments)    Patient unsure of this med.  . Duloxetine Hcl Nausea And Vomiting and Other (See Comments)    Possible seizure like event  . Duloxetine Nausea And Vomiting and Other (See Comments)    Possible seizure like event Possible seizure like event      Past Medical History:  Diagnosis Date  . AIDS (acquired immune deficiency syndrome) ([place])   . 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 Anxiety   . Carcinoma in situ of anal canal   . 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 Depression   .  GERD (gastroesophageal reflux disease)   . HIV (human immunodeficiency virus infection) ([place])   . Hyperlipidemia   . Hypertension   . Lesion of left lung May 2012  . Peripheral neuropathy   . 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    Physical Exam  Gen: Alert, oriented. Appears stated age.  HEENT: [place]/AT. PERRLA. Lungs: CTA, no wheezes. CV: RR nl S1, S2. Abd: soft, benign, no masses. BS+ Ext: No edema. Pulses 2+    Planned procedures: Proceed with colonoscopy. The patient understands the nature of the planned procedure, indications, risks, alternatives and potential complications including but not limited to bleeding, infection, perforation, damage to internal organs and possible oversedation/side effects from  anesthesia. The patient agrees and gives consent to proceed.  Please refer to procedure notes for findings, recommendations and patient disposition/instructions.     Teodoro K. Alice Reichert, M.D. Gastroenterology 06/28/2018  12:17 PM

## 2018-06-28 NOTE — Anesthesia Preprocedure Evaluation (Signed)
Anesthesia Evaluation  Patient identified by MRN, date of birth, ID band Patient awake    Reviewed: Allergy & Precautions, H&P , NPO status , Patient's Chart, lab work & pertinent test results  History of Anesthesia Complications Negative for: history of anesthetic complications  Airway Mallampati: II  TM Distance: >3 FB Neck ROM: full    Dental  (+) Upper Dentures, Lower Dentures   Pulmonary neg shortness of breath, asthma , former smoker,           Cardiovascular Exercise Tolerance: Good (-) angina(-) Past MI and (-) DOE negative cardio ROS       Neuro/Psych PSYCHIATRIC DISORDERS  Neuromuscular disease    GI/Hepatic Neg liver ROS, GERD  Medicated and Controlled,  Endo/Other  negative endocrine ROS  Renal/GU negative Renal ROS  negative genitourinary   Musculoskeletal   Abdominal   Peds  Hematology negative hematology ROS (+)   Anesthesia Other Findings Past Medical History: No date: AIDS (acquired immune deficiency syndrome) ([place]) 03/04/2015: AIDS-related lymphoma ([place]) No date: Anxiety No date: Carcinoma in situ of anal canal No date: 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[place]) No date: Depression No date: GERD (gastroesophageal reflux disease) No date: HIV (human immunodeficiency virus infection) ([place]) No date: Hyperlipidemia May 2012: Lesion of left lung No date: Peripheral neuropathy No date: Pneumocystosis [facility])  Past Surgical History: No date: APPENDECTOMY No date: LUMBAR DISC SURGERY     Comment:  cervicle/lumbar multiple levels No date: percutaneous biopsy lung  BMI    Body Mass Index:  20.45 kg/m      Reproductive/Obstetrics negative OB ROS                             Anesthesia Physical Anesthesia Plan  ASA: III  Anesthesia Plan: General   Post-op Pain Management:    Induction: Intravenous  PONV Risk Score and Plan: Propofol infusion and TIVA  Airway Management Planned:  Natural Airway and Nasal Cannula  Additional Equipment:   Intra-op Plan:   Post-operative Plan:   Informed Consent: I have reviewed the patients History and Physical, chart, labs and discussed the procedure including the risks, benefits and alternatives for the proposed anesthesia with the patient or authorized representative who has indicated his/her understanding and acceptance.   Dental Advisory Given  Plan Discussed with: Anesthesiologist, CRNA and Surgeon  Anesthesia Plan Comments: (Patient consented for risks of anesthesia including but not limited to:  - adverse reactions to medications - risk of intubation if required - damage to teeth, lips or other oral mucosa - sore throat or hoarseness - Damage to heart, brain, lungs or loss of life  Patient voiced understanding.)        Anesthesia Quick Evaluation

## 2018-06-28 NOTE — Op Note (Signed)
[facility] Gastroenterology Patient Name: Steven Compton Procedure Date: 06/28/2018 1:41 PM MRN: [id number] Account #: [id number] Date of Birth: 1960-09-15 Admit Type: Outpatient Age: 57 Room: [facility] ENDO ROOM 3 Gender: Male Note Status: Finalized Procedure:            Colonoscopy Indications:          Screening for colorectal malignant neoplasm Providers:            Benay Pike. Alice Reichert MD, MD Referring MD:         Charlaine Dalton (Referring MD) Medicines:            Propofol per Anesthesia Complications:        No immediate complications. Procedure:            Pre-Anesthesia Assessment:                       - The risks and benefits of the procedure and the                        sedation options and risks were discussed with the                        patient. All questions were answered and informed                        consent was obtained.                       - Patient identification and proposed procedure were                        verified prior to the procedure by the nurse. The                        procedure was verified in the procedure room.                       - ASA Grade Assessment: III - A patient with severe                        systemic disease.                       - After reviewing the risks and benefits, the patient                        was deemed in satisfactory condition to undergo the                        procedure.                       After obtaining informed consent, the colonoscope was                        passed under direct vision. Throughout the procedure,                        the patient's blood pressure, pulse, and oxygen  saturations were monitored continuously. The                        Colonoscope was introduced through the anus and                        advanced to the the cecum, identified by appendiceal                        orifice and ileocecal valve. The colonoscopy was              somewhat difficult due to poor endoscopic                        visualization. Successful completion of the procedure                        was aided by lavage. The patient tolerated the                        procedure well. The quality of the bowel preparation                        was good except the sigmoid colon was unsatisfactory                        and the descending colon was unsatisfactory. The                        ileocecal valve, appendiceal orifice, and rectum were                        photographed. Findings:      The perianal exam findings include non-thrombosed external hemorrhoids       and internal hemorrhoids that prolapse with straining, but spontaneously       regress to the resting position (Grade II).      The colon (entire examined portion) appeared normal. Stool prevented       adequate visualization of the left colon.      Non-bleeding internal hemorrhoids were found during retroflexion. The       hemorrhoids were Grade I (internal hemorrhoids that do not prolapse).      Anal papilla(e) were hypertrophied. Impression:           - Non-thrombosed external hemorrhoids and internal                        hemorrhoids that prolapse with straining, but                        spontaneously regress to the resting position (Grade                        II) found on perianal exam.                       - The entire examined colon is normal.                       - Non-bleeding internal hemorrhoids.                       -  Anal papilla(e) were hypertrophied.                       - No specimens collected. Recommendation:       - Patient has a contact number available for                        emergencies. The signs and symptoms of potential                        delayed complications were discussed with the patient.                        Return to normal activities tomorrow. Written discharge                        instructions were provided to the  patient.                       - Resume previous diet.                       - Continue present medications.                       - Repeat colonoscopy in 1 year for screening purposes.                       - Return to GI office PRN.                       - The findings and recommendations were discussed with                        the patient and their family. Procedure Code(s):    --- Professional ---                       G6269, Colorectal cancer screening; colonoscopy on                        individual not meeting criteria for high risk Diagnosis Code(s):    --- Professional ---                       K62.89, Other specified diseases of anus and rectum                       K64.4, Residual hemorrhoidal skin tags                       K64.1, Second degree hemorrhoids                       Z12.11, Encounter for screening for malignant neoplasm                        of colon CPT copyright 2018 American Medical Association. All rights reserved. The codes documented in this report are preliminary and upon coder review may  be revised to meet current compliance requirements. Efrain Sella MD, MD 06/28/2018 2:25:37 PM This report has been signed electronically. Number of Addenda: 0 Note Initiated On: 06/28/2018 1:41 PM Scope  Withdrawal Time: 0 hours 12 minutes 59 seconds  Total Procedure Duration: 0 hours 22 minutes 24 seconds       [facility]

## 2018-06-28 NOTE — Transfer of Care (Signed)
Immediate Anesthesia Transfer of Care Note  Patient: Steven Compton  Procedure(s) Performed: COLONOSCOPY WITH PROPOFOL (N/A )  Patient Location: PACU  Anesthesia Type:General  Level of Consciousness: awake, alert  and oriented  Airway & Oxygen Therapy: Patient Spontanous Breathing and Patient connected to nasal cannula oxygen  Post-op Assessment: Report given to RN and Post -op Vital signs reviewed and stable  Post vital signs: Reviewed and stable  Last Vitals:  Vitals Value Taken Time  BP    Temp    Pulse 72 06/28/2018  2:23 PM  Resp 20 06/28/2018  2:23 PM  SpO2 100 % 06/28/2018  2:23 PM  Vitals shown include unvalidated device data.  Last Pain:  Vitals:   06/28/18 1308  TempSrc: Tympanic  PainSc: 0-No pain         Complications: No apparent anesthesia complications

## 2018-06-28 NOTE — Interval H&P Note (Signed)
History and Physical Interval Note:  06/28/2018 1:48 PM  Steven Compton  has presented today for surgery, with the diagnosis of CCA  The various methods of treatment have been discussed with the patient and family. After consideration of risks, benefits and other options for treatment, the patient has consented to  Procedure(s): COLONOSCOPY WITH PROPOFOL (N/A) as a surgical intervention .  The patient's history has been reviewed, patient examined, no change in status, stable for surgery.  I have reviewed the patient's chart and labs.  Questions were answered to the patient's satisfaction.     [place], [place]

## 2018-06-29 ENCOUNTER — Encounter: Payer: Self-pay | Admitting: Internal Medicine

## 2018-06-29 NOTE — Anesthesia Postprocedure Evaluation (Signed)
Anesthesia Post Note  Patient: Steven Compton  Procedure(s) Performed: COLONOSCOPY WITH PROPOFOL (N/A )  Patient location during evaluation: Endoscopy Anesthesia Type: General Level of consciousness: awake and alert Pain management: pain level controlled Vital Signs Assessment: post-procedure vital signs reviewed and stable Respiratory status: spontaneous breathing, nonlabored ventilation, respiratory function stable and patient connected to nasal cannula oxygen Cardiovascular status: blood pressure returned to baseline and stable Postop Assessment: no apparent nausea or vomiting Anesthetic complications: no     Last Vitals:  Vitals:   06/28/18 1308 06/28/18 1423  BP: (!) 161/105 132/86  Pulse: 96 74  Resp: 18 17  Temp: 37 C (!) 36.2 C  SpO2: 100% 100%    Last Pain:  Vitals:   06/29/18 0743  TempSrc:   PainSc: 0-No pain                 Steven Compton

## 2018-07-04 ENCOUNTER — Ambulatory Visit: Admit: 2018-07-04 | Discharge: 2018-07-05 | Payer: MEDICARE

## 2018-07-04 DIAGNOSIS — F3342 Major depressive disorder, recurrent, in full remission: Principal | ICD-10-CM

## 2018-07-04 DIAGNOSIS — F419 Anxiety disorder, unspecified: Secondary | ICD-10-CM

## 2018-07-04 MED ORDER — QUETIAPINE 200 MG TABLET
ORAL_TABLET | Freq: Every evening | ORAL | 0 refills | 0 days | Status: CP
Start: 2018-07-04 — End: 2018-09-12

## 2018-07-04 MED ORDER — MIRTAZAPINE 30 MG TABLET
ORAL_TABLET | Freq: Every evening | ORAL | 0 refills | 0.00000 days | Status: CP
Start: 2018-07-04 — End: 2018-09-12

## 2018-07-04 MED ORDER — ALPRAZOLAM 1 MG TABLET
ORAL_TABLET | Freq: Four times a day (QID) | ORAL | 2 refills | 0 days | Status: CP
Start: 2018-07-04 — End: 2018-09-12

## 2018-07-16 ENCOUNTER — Other Ambulatory Visit: Payer: Self-pay | Admitting: Oncology

## 2018-07-16 DIAGNOSIS — C8590 Non-Hodgkin lymphoma, unspecified, unspecified site: Principal | ICD-10-CM

## 2018-07-16 DIAGNOSIS — B20 Human immunodeficiency virus [HIV] disease: Secondary | ICD-10-CM

## 2018-07-17 MED ORDER — MORPHINE SULFATE ER 15 MG PO TBCR: EXTENDED_RELEASE_TABLET | ORAL | 0 refills | Status: DC

## 2018-07-17 MED ORDER — OXYCODONE HCL 10 MG PO TABS: 10.0000 mg | ORAL_TABLET | Freq: Four times a day (QID) | ORAL | 0 refills | Status: DC | PRN

## 2018-08-14 ENCOUNTER — Ambulatory Visit: Payer: Medicare Other | Attending: Infectious Diseases | Admitting: Infectious Diseases

## 2018-08-14 ENCOUNTER — Other Ambulatory Visit
Admission: RE | Admit: 2018-08-14 | Discharge: 2018-08-14 | Disposition: A | Discharge status: Home / Self Care | Payer: Medicare Other | Source: Ambulatory Visit | Attending: Infectious Diseases | Admitting: Infectious Diseases

## 2018-08-14 ENCOUNTER — Encounter: Payer: Self-pay | Admitting: Infectious Diseases

## 2018-08-14 VITALS — BP 166/99 | HR 101 | Temp 97.5°F | Wt 153.0 lb

## 2018-08-14 DIAGNOSIS — F329 Major depressive disorder, single episode, unspecified: Secondary | ICD-10-CM

## 2018-08-14 DIAGNOSIS — Z86004 Personal history of in-situ neoplasm of other and unspecified digestive organs: Secondary | ICD-10-CM | POA: Diagnosis not present

## 2018-08-14 DIAGNOSIS — Z87891 Personal history of nicotine dependence: Secondary | ICD-10-CM

## 2018-08-14 DIAGNOSIS — Z79899 Other long term (current) drug therapy: Secondary | ICD-10-CM

## 2018-08-14 DIAGNOSIS — R911 Solitary pulmonary nodule: Secondary | ICD-10-CM

## 2018-08-14 DIAGNOSIS — Z8572 Personal history of non-Hodgkin lymphomas: Secondary | ICD-10-CM

## 2018-08-14 DIAGNOSIS — B20 Human immunodeficiency virus [HIV] disease: Secondary | ICD-10-CM | POA: Insufficient documentation

## 2018-08-14 DIAGNOSIS — Z8679 Personal history of other diseases of the circulatory system: Secondary | ICD-10-CM

## 2018-08-14 DIAGNOSIS — E785 Hyperlipidemia, unspecified: Secondary | ICD-10-CM | POA: Insufficient documentation

## 2018-08-14 DIAGNOSIS — F419 Anxiety disorder, unspecified: Secondary | ICD-10-CM

## 2018-08-14 DIAGNOSIS — B459 Cryptococcosis, unspecified: Secondary | ICD-10-CM | POA: Diagnosis not present

## 2018-08-14 DIAGNOSIS — Z79891 Long term (current) use of opiate analgesic: Secondary | ICD-10-CM

## 2018-08-14 DIAGNOSIS — C211 Malignant neoplasm of anal canal: Secondary | ICD-10-CM

## 2018-08-14 DIAGNOSIS — Z888 Allergy status to other drugs, medicaments and biological substances status: Secondary | ICD-10-CM

## 2018-08-14 LAB — LIPID PANEL
Cholesterol: 124 mg/dL (ref 0–200)
HDL: 26 mg/dL — ABNORMAL LOW (ref >40)
LDL Cholesterol: 69 mg/dL (ref 0–99)
Total CHOL/HDL Ratio: 4.8 RATIO
Triglycerides: 146 mg/dL (ref <150)
VLDL: 29 mg/dL (ref 0–40)

## 2018-08-14 LAB — COMPREHENSIVE METABOLIC PANEL
ALBUMIN: 4.2 g/dL (ref 3.5–5.0)
ALK PHOS: 118 U/L (ref 38–126)
ALT: 26 U/L (ref 0–44)
ANION GAP: 8 (ref 5–15)
AST: 22 U/L (ref 15–41)
BUN: 12 mg/dL (ref 6–20)
CO2: 27 mmol/L (ref 22–32)
Calcium: 9.2 mg/dL (ref 8.9–10.3)
Chloride: 104 mmol/L (ref 98–111)
Creatinine, Ser: 1.31 mg/dL — ABNORMAL HIGH (ref 0.61–1.24)
GFR calc Af Amer: >60 mL/min (ref >60)
GFR calc non Af Amer: >60 mL/min (ref >60)
GLUCOSE: 115 mg/dL — AB (ref 70–99)
Potassium: 3.2 mmol/L — ABNORMAL LOW (ref 3.5–5.1)
SODIUM: 139 mmol/L (ref 135–145)
Total Bilirubin: 0.4 mg/dL (ref 0.3–1.2)
Total Protein: 7.1 g/dL (ref 6.5–8.1)

## 2018-08-14 NOTE — Progress Notes (Signed)
NAME: Steven Compton  DOB: [birth year]  MRN: [id number]  Date/Time: 08/14/2018 9:31 AM Subjective:  REASON FOR CONSULT: here to engage in care for HIV ? Steven Compton is a 57 y.o. with a history of AIDS, diagnoed in 52 Started out in [place] and then Dr.Blocker and then Dr.Fitzgerald .  Last labs from June 2019 177 ( 12%) on 02/24/18, VL < 20 On Combivir, kaletra and viread since 2005, 100% adherent to meds- says he is taking too many pills  [place] Cd4 <100 OI pneumocystosis/cryptococcosis ( pt not ware of the latter) HAARt history Since 2006 Combivir, kaletra and tenofovir Acquired thru sex with men Genotype- NA ?  Medical History  HTN ( says he was treated a long time ago but not on any meds as it was fine) HIV NH Lymphoma in remission Hyperlipidemia Carcinoma in situ of anal canal Incidental lung nodule Depression/anxiety pneumocystosis Cryptococcis Past Medical History:  Diagnosis Date  . AIDS (acquired immune deficiency syndrome) ([place])   . 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 Anxiety   . Carcinoma in situ of anal canal   . 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 Depression   . GERD (gastroesophageal reflux disease)   . HIV (human immunodeficiency virus infection) ([place])   . Hyperlipidemia   . Lesion of left lung May 2012  . Peripheral neuropathy   . Pneumocystosis [facility])     Past Surgical History:  Procedure Laterality Date  . APPENDECTOMY    . COLONOSCOPY WITH PROPOFOL N/A 06/28/2018   Procedure: COLONOSCOPY WITH PROPOFOL;  Surgeon: Toledo, Benay Pike, MD;  Location: [facility] ENDOSCOPY;  Service: Gastroenterology;  Laterality: N/A;  . LUMBAR DISC SURGERY     cervicle/lumbar multiple levels  . percutaneous biopsy lung      SH Former smoker Occasional alcohol and marijuana No hard drugs Lives with his significant other for the past 30 yrs and he is not positive   No family history on file. Allergies  Allergen Reactions  . Abacavir Nausea And Vomiting, Anaphylaxis and Rash  .  Zalcitabine Anaphylaxis, Rash and Other (See Comments)    Patient unsure of this med.  . Duloxetine Hcl Nausea And Vomiting and Other (See Comments)    Possible seizure like event  . Duloxetine Nausea And Vomiting and Other (See Comments)    Possible seizure like event Possible seizure like event   Current Meds Alprazolam Mirtazapine Quetiapine Oxycodone Morphine sulfate Promethazine combivir viread kaletra Ibuprofen as and when needed levocetrizine Nasonex Meclizine- PRN- once or twice a month proair Ketoconazole 2% Omeprazole Loratadine 10mg   vitamin   ?    REVIEW OF SYSTEMS:  Const: negative fever, negative chills, negative weight loss Eyes: negative diplopia or visual changes, negative eye pain ENT: negative coryza, negative sore throat Resp: negative cough, hemoptysis, dyspnea Cards: negative for chest pain, palpitations, lower extremity edema GU: negative for frequency, dysuria and hematuria Skin: negative for rash and pruritus Heme: negative for easy bruising and gum/nose bleeding MS: has pain in th surgical site in his neck Neurolo:negative for headaches, occasional dizziness, vertigo, memory problems  Psych: anxiety, depression   Objective:  VITALS:  BP (!) 166/99 (BP Location: Left Arm, Patient Position: Sitting, Cuff Size: Normal)   Pulse (!) 101   Temp (!) 97.5 F (36.4 C) (Oral)   Wt 153 lb (69.4 kg)   BMI 20.19 kg/m  PHYSICAL EXAM:  General: Alert, cooperative, no distress, appears stated age. Thin built Severe lipoatrophy of the face Head: Normocephalic, without obvious abnormality,  atraumatic. Eyes: Conjunctivae clear, anicteric sclerae. Pupils are equal Nose: Nares normal. No drainage or sinus tenderness. Throat: Lips, mucosa, and tongue normal. No Thrush Neck: Supple, symmetrical, scar on th left side , thyroid: non tender no carotid bruit and no JVD. Back: No CVA tenderness. Lungs: Clear to auscultation bilaterally. No Wheezing or  Rhonchi. No rales. Heart: Regular rate and rhythm, no murmur, rub or gallop. Abdomen: Soft, non-tender,not distended. Bowel sounds normal. No masses Extremities: Extremities normal, atraumatic, no cyanosis. No edema. No clubbing Skin: No rashes or lesions. Not Jaundiced Lymph: Cervical, supraclavicular normal. Neurologic: Grossly non-focal   Health maintenance Vaccination pneumovac- 23 Prevnar-13 HepB HepA TdaP Flu Herpes zoster- HPV ______________________ labs RPR HEPC ab Lipid CMv TOXO -IGG/IgM HIV VL Cd4 quantiferon Gold GC/CHL [id number] Genotype HIV antibody  Preventive  Dental Colonoscopy Opthal Anal /  Impression/Recommendation ?57 y.o. with a history of AIDS, diagnoed in 69 Started out in [facility] and then Dr.Blocker and then Dr.Fitzgerald .  Last labs from June 2019 177 ( 12%) on 02/24/18, VL < 20 On Combivir, kaletra and viread since 2005  AIDS- on combivir ( NRTI) , kaletra ( [facility]) and viread since 2005- 100% adherent- Vl is undetectable but Cd4 < 250 He has severe lipoatrophy of the face and it could be from AZT in combivir- He has been on many drugs before that but does not remember - some drugs are familiar like AZT, sustiva, viracept,  combivir and kaletra are no longer first line drugs to treat HIV. We can get a genosure archive today to see which new drugs he can use. As he has not been exposed to integrase inhibitor we can use that along with a combination Will do labs today  Hyperlipidemia- on atorvastatin Lesion on the rt hand ? Keratoacanthoma ?? BCC- refer to [facility] ( Derm) he had seen him before. He needs a new PCP  H/o NHL in remission Carcinoma in situ- anal- need to follow up with new anal pap  On MS contin and oxycodone for pain from his oncologist  On antidepressants   Health maintenance need to be updated ?need to get his vaccination status ? ___________________________________________________ Discussed with patient,  follow up in 1 month

## 2018-08-14 NOTE — Patient Instructions (Signed)
You are here to establish HIV care- You are on combivir, [place] and viread since 2006. Today we will do labs to see whether we can switch you to a simpler regimen. Will see you in 1 month

## 2018-08-15 LAB — HELPER T-LYMPH-CD4 (ARMC ONLY)
% CD 4 Pos. Lymph.: 18.9 % — ABNORMAL LOW (ref 30.8–58.5)
Absolute CD 4 Helper: 189 /uL — ABNORMAL LOW (ref 359–1519)
BASOS ABS: 0.1 10*3/uL (ref 0.0–0.2)
Basos: 1 %
EOS (ABSOLUTE): 0 10*3/uL (ref 0.0–0.4)
EOS: 1 %
HEMATOCRIT: 33.3 % — AB (ref 37.5–51.0)
HEMOGLOBIN: 12.3 g/dL — AB (ref 13.0–17.7)
Immature Grans (Abs): 0 10*3/uL (ref 0.0–0.1)
Immature Granulocytes: 1 %
LYMPHS ABS: 1 10*3/uL (ref 0.7–3.1)
Lymphs: 17 %
MCH: 39.9 pg — AB (ref 26.6–33.0)
MCHC: 36.9 g/dL — ABNORMAL HIGH (ref 31.5–35.7)
MCV: 108 fL — AB (ref 79–97)
MONOS ABS: 0.6 10*3/uL (ref 0.1–0.9)
Monocytes: 10 %
NEUTROS PCT: 70 %
Neutrophils Absolute: 4.3 10*3/uL (ref 1.4–7.0)
PLATELETS: 218 10*3/uL (ref 150–450)
RBC: 3.08 x10E6/uL — ABNORMAL LOW (ref 4.14–5.80)
RDW: 12.3 % (ref 12.3–15.4)
WBC: 6 10*3/uL (ref 3.4–10.8)

## 2018-08-15 LAB — HIV-1 RNA QUANT-NO REFLEX-BLD
HIV 1 RNA QUANT: 20 {copies}/mL
LOG10 HIV-1 RNA: 1.301 {Log_copies}/mL

## 2018-08-15 LAB — HEPATITIS PANEL, ACUTE
Hep A IgM: NEGATIVE
Hep B C IgM: NEGATIVE
Hepatitis B Surface Ag: NEGATIVE

## 2018-08-15 LAB — RPR: RPR: NONREACTIVE

## 2018-08-16 ENCOUNTER — Other Ambulatory Visit: Payer: Self-pay | Admitting: Internal Medicine

## 2018-08-16 DIAGNOSIS — C8590 Non-Hodgkin lymphoma, unspecified, unspecified site: Principal | ICD-10-CM

## 2018-08-16 DIAGNOSIS — B20 Human immunodeficiency virus [HIV] disease: Secondary | ICD-10-CM

## 2018-08-16 LAB — QUANTIFERON-TB GOLD PLUS: QUANTIFERON-TB GOLD PLUS: NEGATIVE

## 2018-08-16 LAB — QUANTIFERON-TB GOLD PLUS (RQFGPL)
QUANTIFERON MITOGEN VALUE: 5.98 [IU]/mL
QuantiFERON Nil Value: 0.08 IU/mL
QuantiFERON TB1 Ag Value: 0.06 IU/mL
QuantiFERON TB2 Ag Value: 0.05 IU/mL

## 2018-08-17 ENCOUNTER — Other Ambulatory Visit: Payer: Self-pay | Admitting: *Deleted

## 2018-08-17 DIAGNOSIS — B20 Human immunodeficiency virus [HIV] disease: Secondary | ICD-10-CM

## 2018-08-17 DIAGNOSIS — C8590 Non-Hodgkin lymphoma, unspecified, unspecified site: Principal | ICD-10-CM

## 2018-08-18 ENCOUNTER — Encounter: Payer: Self-pay | Admitting: Nurse Practitioner

## 2018-08-18 ENCOUNTER — Other Ambulatory Visit: Payer: Self-pay | Admitting: *Deleted

## 2018-08-18 ENCOUNTER — Inpatient Hospital Stay: Payer: Medicare Other

## 2018-08-18 ENCOUNTER — Inpatient Hospital Stay: Payer: Medicare Other | Attending: Nurse Practitioner | Admitting: Nurse Practitioner

## 2018-08-18 VITALS — BP 147/95 | HR 102 | Temp 97.6°F | Resp 18 | Wt 152.0 lb

## 2018-08-18 DIAGNOSIS — C8590 Non-Hodgkin lymphoma, unspecified, unspecified site: Secondary | ICD-10-CM

## 2018-08-18 DIAGNOSIS — G8929 Other chronic pain: Secondary | ICD-10-CM | POA: Insufficient documentation

## 2018-08-18 DIAGNOSIS — K5903 Drug induced constipation: Secondary | ICD-10-CM | POA: Insufficient documentation

## 2018-08-18 DIAGNOSIS — Z87891 Personal history of nicotine dependence: Secondary | ICD-10-CM | POA: Insufficient documentation

## 2018-08-18 DIAGNOSIS — Z79899 Other long term (current) drug therapy: Secondary | ICD-10-CM | POA: Insufficient documentation

## 2018-08-18 DIAGNOSIS — B20 Human immunodeficiency virus [HIV] disease: Secondary | ICD-10-CM | POA: Diagnosis not present

## 2018-08-18 DIAGNOSIS — F419 Anxiety disorder, unspecified: Secondary | ICD-10-CM | POA: Diagnosis not present

## 2018-08-18 DIAGNOSIS — C8338 Diffuse large B-cell lymphoma, lymph nodes of multiple sites: Secondary | ICD-10-CM | POA: Insufficient documentation

## 2018-08-18 DIAGNOSIS — M542 Cervicalgia: Secondary | ICD-10-CM | POA: Diagnosis not present

## 2018-08-18 DIAGNOSIS — T402X5A Adverse effect of other opioids, initial encounter: Secondary | ICD-10-CM | POA: Diagnosis not present

## 2018-08-18 DIAGNOSIS — Z923 Personal history of irradiation: Secondary | ICD-10-CM | POA: Insufficient documentation

## 2018-08-18 MED ORDER — OXYCODONE HCL 10 MG PO TABS: 10.0000 mg | ORAL_TABLET | Freq: Four times a day (QID) | ORAL | 0 refills | Status: DC | PRN

## 2018-08-18 MED ORDER — GABAPENTIN 100 MG PO CAPS: ORAL_CAPSULE | ORAL | 0 refills | Status: DC

## 2018-08-18 MED ORDER — MORPHINE SULFATE ER 15 MG PO TBCR: EXTENDED_RELEASE_TABLET | ORAL | 0 refills | Status: DC

## 2018-08-18 NOTE — Progress Notes (Signed)
[facility] [place]  [phone] [phone]  Patient Care Team: Patient, No Pcp Per as PCP - General (General Practice)   Name of the patient: Steven Compton  [id number]  1961-08-22   Date of visit: 08/18/18  Diagnosis- DLBCL r/t AIDS  Chief complaint/ Reason for visit- Chronic Pain  Heme/Onc history:   Oncology History   # 2006-DLBCL Stage II s/p R-CHOP x6; NED  # SEP 2016- ANAL Lesions s/p Resection [[facility]]; non-invasive AIN  # Chronic neck pain/ AIDS on ART [Dr.Fitzgerald]     AIDS-related lymphoma ([place])    Interval history- Steven Compton, 57 year old male with above history of AIDS related lymphoma who presents to [facility] for evaluation of chronic neck pain. He states he developed neck pain after his surgery and radiation to his neck and it has persisted since that time. He describes pain as pulling tightness on left side at neck, most sensitive at surgical scar. He reports pain all the time with episodes of more intense pain. He has been taking MS Contin for long-acting and oxycodone for breakthrough for many years. He recalls having tried gabapentin 'years ago' prior to or around his time of diagnosis but not again since. He has not been seen in a pain clinic before. He has not had any procedures for his pain.   He takes xanax 1 mg by mouth 4 times a day chronically for anxiety per psychiatry, Dr. Bernadene Bell & Dr. Berdine Addison. He also takes seroquel and remeron per psychiatry. Estimates he has been on xanax for approximately 30 years.   He has history of chronic constipation (r/t narcotics) which is controlled with miralax otc. Reports bowel movement every day to every other day without pain or straining. Colonoscopy was performed on 06/28/18 by Dr. Alice Reichert. Per report, hemorrhoids but overall normal with plan to repeat in 1 year.   Per psychiatry's note, he previously tried slow reduction of benzodiazepines  which failed due to increase in anxiety and irritability.  He has been trying to reduce his opiate use.  Today, he reports that he tries going longer durations between doses but is unsure if he has been successful.  Today, he is tearful that "we are going to take his meds away".   He is followed by infectious disease for AIDS. He is followed by Dr. Delaine Lame. Per last note, VI was undetectable but Cd4 < 250. He reports compliance with medications.   ECOG FS:1 - Symptomatic but completely ambulatory  Review of systems- Review of Systems  Constitutional: Negative.   HENT: Negative.   Eyes: Negative.   Respiratory: Negative.   Cardiovascular: Negative.   Gastrointestinal: Negative.   Genitourinary: Negative.   Musculoskeletal: Positive for neck pain (at site of surgery and radiation).  Skin: Negative.   Neurological: Positive for sensory change (numbness at site of scar on neck). Negative for dizziness, speech change, weakness and headaches.  Endo/Heme/Allergies: Negative.   Psychiatric/Behavioral: Positive for depression (stable). Negative for substance abuse and suicidal ideas. The patient is nervous/anxious (controlled with QID xanax) and has insomnia (controlled on current regimen).    Current treatment- surveillance  Allergies  Allergen Reactions  . Abacavir Nausea And Vomiting, Anaphylaxis and Rash  . Zalcitabine Anaphylaxis, Rash and Other (See Comments)    Patient unsure of this med.  . Duloxetine Hcl Nausea And Vomiting and Other (See Comments)    Possible seizure like event  . Duloxetine Nausea And Vomiting and Other (See  Comments)    Possible seizure like event Possible seizure like event     Past Medical History:  Diagnosis Date  . AIDS (acquired immune deficiency syndrome) ([place])   . 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 Anxiety   . Carcinoma in situ of anal canal   . 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 Depression   . GERD (gastroesophageal reflux disease)   . HIV  (human immunodeficiency virus infection) ([place])   . Hyperlipidemia   . Lesion of left lung May 2012  . Peripheral neuropathy   . Pneumocystosis [facility])     Past Surgical History:  Procedure Laterality Date  . APPENDECTOMY    . COLONOSCOPY WITH PROPOFOL N/A 06/28/2018   Procedure: COLONOSCOPY WITH PROPOFOL;  Surgeon: Toledo, Benay Pike, MD;  Location: [facility] ENDOSCOPY;  Service: Gastroenterology;  Laterality: N/A;  . LUMBAR DISC SURGERY     cervicle/lumbar multiple levels  . percutaneous biopsy lung      Social History   Socioeconomic History  . Marital status: Significant Other    Spouse name: Not on file  . Number of children: Not on file  . Years of education: Not on file  . Highest education level: Not on file  Occupational History  . Not on file  Social Needs  . Financial resource strain: Not on file  . Food insecurity:    Worry: Not on file    Inability: Not on file  . Transportation needs:    Medical: Not on file    Non-medical: Not on file  Tobacco Use  . Smoking status: Former Smoker    Types: Cigarettes    Last attempt to quit: 09/13/2004    Years since quitting: 13.9  . Smokeless tobacco: Former Network engineer and Sexual Activity  . Alcohol use: Not Currently  . Drug use: Never  . Sexual activity: Not on file  Lifestyle  . Physical activity:    Days per week: Not on file    Minutes per session: Not on file  . Stress: Not on file  Relationships  . Social connections:    Talks on phone: Not on file    Gets together: Not on file    Attends religious service: Not on file    Active member of club or organization: Not on file    Attends meetings of clubs or organizations: Not on file    Relationship status: Not on file  . Intimate partner violence:    Fear of current or ex partner: Not on file    Emotionally abused: Not on file    Physically abused: Not on file    Forced sexual activity: Not on file  Other Topics Concern  . Not on file  Social History  Narrative  . Not on file    History reviewed. No pertinent family history.   Current Outpatient Medications:  .  albuterol (PROVENTIL HFA) 108 (90 BASE) MCG/ACT inhaler, Inhale 1-2 puffs into the lungs every 6 (six) hours as needed for shortness of breath. , Disp: , Rfl:  .  ALPRAZolam (XANAX) 1 MG tablet, Take 1 mg by mouth., Disp: , Rfl:  .  aspirin EC 81 MG tablet, Take 81 mg by mouth daily., Disp: , Rfl:  .  dronabinol (MARINOL) 10 MG capsule, Take 10 mg by mouth 2 (two) times daily before a meal., Disp: , Rfl:  .  fluconazole (DIFLUCAN) 200 MG tablet, Take 200 mg by mouth. Reported on 03/05/2016, Disp: , Rfl:  .  folic  acid (FOLVITE) 400 MCG tablet, Take 0.8 mg by mouth., Disp: , Rfl:  .  ibuprofen (ADVIL,MOTRIN) 200 MG tablet, Take 400 mg by mouth., Disp: , Rfl:  .  ketoconazole (NIZORAL) 2 % cream, Apply topically., Disp: , Rfl:  .  lamiVUDine-zidovudine (COMBIVIR) 150-300 MG tablet, Take by mouth., Disp: , Rfl:  .  lopinavir-ritonavir (KALETRA) 200-50 MG tablet, Take 2 tablets by mouth 2 (two) times daily with a meal. , Disp: , Rfl:  .  meclizine (ANTIVERT) 25 MG tablet, Take 25 mg by mouth., Disp: , Rfl:  .  mirtazapine (REMERON) 30 MG tablet, Take 60 mg by mouth., Disp: , Rfl:  .  mometasone (NASONEX) 50 MCG/ACT nasal spray, Frequency:QD   Dosage:50   MCG  Instructions:  Note:Dose: 50 MCG, Disp: , Rfl:  .  morphine (MS CONTIN) 15 MG 12 hr tablet, Take 1 tablet q AM and 2 tablets q PM orally, Disp: 90 tablet, Rfl: 0 .  Oxycodone HCl 10 MG TABS, Take 1 tablet (10 mg total) by mouth every 6 (six) hours as needed., Disp: 120 tablet, Rfl: 0 .  promethazine (PHENERGAN) 25 MG tablet, Take 25 mg by mouth., Disp: , Rfl:  .  QUEtiapine (SEROQUEL XR) 200 MG 24 hr tablet, Take 200 mg by mouth at bedtime., Disp: , Rfl:  .  tenofovir (VIREAD) 300 MG tablet, Take 300 mg by mouth., Disp: , Rfl:   Physical exam:  Vitals:   08/18/18 1103  BP: (!) 147/95  Pulse: (!) 102  Resp: 18  Temp: 97.6  F (36.4 C)  TempSrc: Tympanic  Weight: 152 lb (68.9 kg)   Physical Exam Constitutional:      Appearance: He is cachectic.     Comments: Accompanied by partner  HENT:     Right Ear: External ear normal.     Left Ear: External ear normal.  Eyes:     General: No scleral icterus.    Conjunctiva/sclera: Conjunctivae normal.  Neck:     Comments: Well healed surgical scar left neck Cardiovascular:     Rate and Rhythm: Normal rate and regular rhythm.  Pulmonary:     Effort: Pulmonary effort is normal.     Breath sounds: Normal breath sounds.  Abdominal:     General: Abdomen is flat.     Palpations: Abdomen is soft.  Lymphadenopathy:     Cervical: No cervical adenopathy.  Skin:    General: Skin is warm and dry.  Neurological:     Mental Status: He is alert and oriented to person, place, and time.     Gait: Gait normal.  Psychiatric:        Mood and Affect: Mood is anxious. Affect is tearful.        Speech: Speech normal.        Behavior: Behavior is cooperative.        Thought Content: Thought content normal.        Cognition and Memory: Cognition and memory normal.      CMP Latest Ref Rng & Units 08/14/2018  Glucose 70 - 99 mg/dL 115(H)  BUN 6 - 20 mg/dL 12  Creatinine 0.61 - 1.24 mg/dL 1.31(H)  Sodium 135 - 145 mmol/L 139  Potassium 3.5 - 5.1 mmol/L 3.2(L)  Chloride 98 - 111 mmol/L 104  CO2 22 - 32 mmol/L 27  Calcium 8.9 - 10.3 mg/dL 9.2  Total Protein 6.5 - 8.1 g/dL 7.1  Total Bilirubin 0.3 - 1.2 mg/dL 0.4  Alkaline Phos 38 -  126 U/L 118  AST 15 - 41 U/L 22  ALT 0 - 44 U/L 26   CBC Latest Ref Rng & Units 08/14/2018  WBC 3.4 - 10.8 x10E3/uL 6.0  Hemoglobin 13.0 - 17.7 g/dL 12.3(L)  Hematocrit 37.5 - 51.0 % 33.3(L)  Platelets 150 - 450 x10E3/uL 218    No images are attached to the encounter.  No results found.  Assessment and plan- Patient is a 57 y.o. male with history of AIDS related DLBCL, who presents to symptom management for chronic pain.   1. 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   2.  Chronic pain-related to diagnosis and treatment of AIDS related lymphoma.  Discussed in detail risks versus benefits of opioid usage. Pain contract reviewed with patient and signed today. He will continue to see Dr. Purvis Kilts with psychiatry (see below). Patient unable to void in clinic today, and at his request, serum drug screen drawn; results pending.  Plan for trial dose reduction of opioids with maximizing non-opioid pain management strategies. Based on assessment, I suspect he has neuropathic and soft tissue (radiation & surgical changes).  Continue MS Contin 15 mg in AM and 30 mg QHS for long-acting pain control. Reduce Oxycodone 10 mg tablets. Currently taking 4 times a day. This  Discussed reducing night-time dose to 1/2 tab (5mg ). Start gabapentin 100mg  with gradual taper to TID to assess tolerance.   3. Controlled Substances Risk Management-patient signed opioid pain medication agreement today in clinic.  [place] was reviewed today and was found to be appropriate.  Patient declined urine drug screen today and requested serum which was drawn.  Results pending.  Reviewed information with patient regarding safe storage and administration of medications.  Patient has not received prescription for Narcan but would consider in future.  4.  Opioid-induced constipation-currently well controlled.  Discussed today that goal is for patient to have bowel movement without pain or straining every 1 to 2 days.  Discussed use of senna and/or MiraLAX to achieve this.  5. Anxiety Disorder- currently managed by Dr. Purvis Kilts [facility] Psychiatry) and followed for medication management. Currently prescribed remeron, seroquel, and xanax. Per note, no concern of substance abuse & reviews of NCCSR normal and he has been stable on medications for many years.   Today, will refill MS Contin and Oxycodone as previously  prescribed and start gabapentin titration to assess tolerance. Patient will begin gradual dose reduction of oxycodone. Pain contract signed today. [place] reviewed today & appropriate. Serum drug screen drawn today.   RTC in 1 month for evaluation with Billey Chang, Palliative Care NP and Drug Screen.    Visit Diagnosis 1. AIDS-related lymphoma ([place])   2. Other chronic pain     Patient expressed understanding and was in agreement with this plan. He also understands that He can call clinic at any time with any questions, concerns, or complaints.   Thank you for allowing me to participate in the care of this patient.   A total of (40) minutes of face-to-face time was spent with this patient with greater than 50% of that time in counseling and care-coordination.  Beckey Rutter, DNP, AGNP-C [place] at [facility] [phone] (work cell) [phone] (office)   Addendum: 08/24/18- Patient called to follow-up.  He has decreased oxycodone nighttime dose to 1/2 tablet (5 mg).  He is tolerating this well and has not noticed any change in his pain level or impaired  sleep.  No symptoms of withdrawal.  Currently taking gabapentin 100 mg 3 times daily.  Has noticed increased daytime drowsiness.  We discussed decreasing morning oxycodone dose to 1/2 tablet (5 mg) and reducing gabapentin to twice daily.  Additional gabapentin prescription sent today.  Serum drug screen results pending.  Patient will return call in 1 week to reassess.  Addendum: 08/25/2018-patient serum drug screen resulted today.  Findings consistent for positive for benzodiazepines.  Confirmation consistent with Xanax which is an expected finding.  Positive for opiate.  Confirmation consistent with morphine which is an expected finding.  Positive for THC-confirmation consistent with marijuana which is an unexpected finding.  Presumptive immunoassay result was negative for oxycodone.  Definitive confirmation was also negative for  oxycodone.  This was an unexpected finding.  Results were discussed with [name], toxicologist at [facility] who agreed with interpretation of results.  Given patient's psychiatric history, I reached out to Dr. Bernadene Bell at [facility]. She was made aware of results. She advised that she is not concerned for self-harm at this time nor should that become an issue when he is confronted with results. Discussed providing him with crisis phone line number and advise patient that she could see him sooner than his scheduled appt if needed.  I will discuss results with Dr. Rogue Bussing and discuss options for follow-up.   CC: Dr. Rogue Bussing

## 2018-08-21 LAB — HLA B*5701: HLA B 5701: NEGATIVE

## 2018-08-22 LAB — THC,MS,WB/SP RFX
CANNABINOL: NEGATIVE ng/mL
Cannabidiol: NEGATIVE ng/mL
Cannabinoid Confirmation: POSITIVE
Carboxy-THC: 43.5 ng/mL
Hydroxy-THC: 1 ng/mL
Tetrahydrocannabinol(THC): 6.9 ng/mL

## 2018-08-23 LAB — BENZODIAZEPINES,MS,WB/SP RFX
7-Aminoclonazepam: NEGATIVE ng/mL
ALPRAZOLAM: 3.8 ng/mL
Benzodiazepines Confirm: POSITIVE
Chlordiazepoxide: NEGATIVE ng/mL
Clonazepam: NEGATIVE ng/mL
Desalkylflurazepam: NEGATIVE ng/mL
Desmethylchlordiazepoxide: NEGATIVE ng/mL
Desmethyldiazepam: NEGATIVE ng/mL
Diazepam: NEGATIVE ng/mL
FLURAZEPAM: NEGATIVE ng/mL
Lorazepam: NEGATIVE ng/mL
MIDAZOLAM: NEGATIVE ng/mL
Oxazepam: NEGATIVE ng/mL
Temazepam: NEGATIVE ng/mL
Triazolam: NEGATIVE ng/mL

## 2018-08-24 LAB — OXYCODONES,MS,WB/SP RFX
Oxycocone: NEGATIVE ng/mL
Oxycodones Confirmation: NEGATIVE
Oxymorphone: NEGATIVE ng/mL

## 2018-08-24 LAB — OPIATES,MS,WB/SP RFX
6-Acetylmorphine: NEGATIVE
Codeine: NEGATIVE ng/mL
Dihydrocodeine: NEGATIVE ng/mL
Hydrocodone: NEGATIVE ng/mL
Hydromorphone: NEGATIVE ng/mL
Morphine: 1.8 ng/mL
OPIATE CONFIRMATION: POSITIVE

## 2018-08-24 LAB — DRUG SCREEN 10 W/CONF, SERUM
Amphetamines, IA: NEGATIVE ng/mL
Barbiturates, IA: NEGATIVE ug/mL
Benzodiazepines, IA: POSITIVE ng/mL
Cocaine & Metabolite, IA: NEGATIVE ng/mL
Methadone, IA: NEGATIVE ng/mL
Opiates, IA: POSITIVE ng/mL
Oxycodones, IA: NEGATIVE ng/mL
Phencyclidine, IA: NEGATIVE ng/mL
Propoxyphene, IA: NEGATIVE ng/mL
THC(Marijuana) Metabolite, IA: POSITIVE ng/mL

## 2018-08-24 MED ORDER — GABAPENTIN 100 MG PO CAPS: 100.0000 mg | ORAL_CAPSULE | Freq: Two times a day (BID) | ORAL | 0 refills | Status: DC

## 2018-09-12 ENCOUNTER — Encounter: Admit: 2018-09-12 | Discharge: 2018-09-13 | Payer: MEDICARE

## 2018-09-12 DIAGNOSIS — F3342 Major depressive disorder, recurrent, in full remission: Principal | ICD-10-CM

## 2018-09-12 MED ORDER — MIRTAZAPINE 30 MG TABLET
ORAL_TABLET | Freq: Every evening | ORAL | 0 refills | 0.00000 days | Status: CP
Start: 2018-09-12 — End: 2018-11-14

## 2018-09-12 MED ORDER — QUETIAPINE 200 MG TABLET
ORAL_TABLET | Freq: Every evening | ORAL | 0 refills | 0 days | Status: CP
Start: 2018-09-12 — End: 2018-11-14

## 2018-09-12 MED ORDER — ALPRAZOLAM 1 MG TABLET
ORAL_TABLET | Freq: Four times a day (QID) | ORAL | 2 refills | 0 days | Status: CP
Start: 2018-09-12 — End: 2018-11-14

## 2018-09-14 ENCOUNTER — Inpatient Hospital Stay: Payer: Medicare Other | Attending: Hospice and Palliative Medicine | Admitting: Hospice and Palliative Medicine

## 2018-09-14 ENCOUNTER — Other Ambulatory Visit: Payer: Self-pay

## 2018-09-14 ENCOUNTER — Ambulatory Visit: Payer: Medicare Other | Admitting: Infectious Diseases

## 2018-09-14 ENCOUNTER — Other Ambulatory Visit
Admission: RE | Admit: 2018-09-14 | Discharge: 2018-09-14 | Disposition: A | Discharge status: Home / Self Care | Payer: Medicare Other | Attending: Infectious Diseases | Admitting: Infectious Diseases

## 2018-09-14 ENCOUNTER — Encounter: Payer: Self-pay | Admitting: Hospice and Palliative Medicine

## 2018-09-14 VITALS — BP 161/102 | HR 93 | Temp 97.6°F | Resp 20 | Ht 73.0 in | Wt 152.0 lb

## 2018-09-14 DIAGNOSIS — K649 Unspecified hemorrhoids: Secondary | ICD-10-CM | POA: Insufficient documentation

## 2018-09-14 DIAGNOSIS — Z515 Encounter for palliative care: Secondary | ICD-10-CM | POA: Diagnosis not present

## 2018-09-14 DIAGNOSIS — Z7189 Other specified counseling: Secondary | ICD-10-CM | POA: Diagnosis not present

## 2018-09-14 DIAGNOSIS — B20 Human immunodeficiency virus [HIV] disease: Secondary | ICD-10-CM | POA: Insufficient documentation

## 2018-09-14 DIAGNOSIS — G8929 Other chronic pain: Secondary | ICD-10-CM | POA: Diagnosis not present

## 2018-09-14 NOTE — Patient Instructions (Signed)
Please make an apt with Dr. Alice Reichert at [facility] to evaluate your hemorrhoids.

## 2018-09-14 NOTE — Progress Notes (Signed)
[place]  Telephone:([phone] [phone]   Name: Steven Compton Date: 09/14/2018 MRN: [id number]  DOB: 1961/08/08  Patient Care Team: Patient, No Pcp Per as PCP - General (General Practice)    REASON FOR CONSULTATION: Palliative Care consult requested for this 58 y.o. 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  Patient was seen in the symptom management clinic in December 2019 for follow-up of chronic pain.  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  He was referred to palliative care for further symptom management and discussion of goals.   SOCIAL HISTORY:    Patient lives at home with his partner of 31 years.  Both the patient's parents are deceased.  Patient had a brother who died of HIV.  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  He does not currently work.  ADVANCE DIRECTIVES:  Patient's partner is his healthcare power of attorney  CODE STATUS: Full code  PAST MEDICAL HISTORY: Past Medical History:  Diagnosis Date  . AIDS (acquired immune deficiency syndrome) ([place])   . AIDS-related lymphoma ([place]) 03/04/2015  . Anxiety   . Carcinoma in situ of anal canal   . 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 Depression   . GERD (gastroesophageal reflux disease)   . HIV (human immunodeficiency virus infection) ([place])   . Hyperlipidemia   . Lesion of left lung May 2012  . Peripheral neuropathy   . 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 APPENDECTOMY    . COLONOSCOPY WITH PROPOFOL N/A 06/28/2018   Procedure: COLONOSCOPY WITH PROPOFOL;  Surgeon: Toledo, Benay Pike, MD;  Location: [facility] ENDOSCOPY;  Service: Gastroenterology;  Laterality: N/A;  . LUMBAR DISC SURGERY     cervicle/lumbar multiple levels  . percutaneous biopsy lung      HEMATOLOGY/ONCOLOGY HISTORY:  Oncology History   # 2006-DLBCL Stage II s/p R-CHOP x6; NED  # SEP 2016- ANAL Lesions s/p Resection [[facility]]; non-invasive AIN  # Chronic neck pain/ AIDS on ART [Dr.Fitzgerald]     AIDS-related lymphoma ([place])    ALLERGIES:  is allergic to abacavir; zalcitabine; duloxetine hcl; and duloxetine.  MEDICATIONS:  Current Outpatient Medications  Medication Sig Dispense Refill  . ALPRAZolam (XANAX) 1 MG tablet Take 1 mg by mouth.    Marland Kitchen ibuprofen (ADVIL,MOTRIN) 200 MG tablet Take 400 mg by mouth.    Marland Kitchen ketoconazole (NIZORAL) 2 % cream Apply topically.    Marland Kitchen lamiVUDine-zidovudine (COMBIVIR) 150-300 MG tablet Take by mouth.    . lopinavir-ritonavir (KALETRA) 200-50 MG tablet Take 2 tablets by mouth 2 (two) times daily with a meal.     . mirtazapine (REMERON) 30 MG tablet Take 60 mg by mouth.    . morphine (MS CONTIN) 15 MG 12 hr tablet Take 1 tablet q AM and 2 tablets q PM orally 90 tablet 0  . Oxycodone HCl 10 MG TABS Take 1 tablet (10 mg total) by mouth every 6 (six) hours as needed. 120 tablet 0  . promethazine (PHENERGAN) 25 MG tablet Take 25 mg by mouth.    . QUEtiapine (SEROQUEL XR) 200 MG  24 hr tablet Take 200 mg by mouth at bedtime.    Marland Kitchen tenofovir (VIREAD) 300 MG tablet Take 300 mg by mouth.    Marland Kitchen albuterol (PROVENTIL HFA) 108 (90 BASE) MCG/ACT inhaler Inhale 1-2 puffs into the lungs every 6 (six) hours as needed for shortness of breath.     . meclizine (ANTIVERT) 25 MG tablet Take 25 mg by mouth.    . mometasone (NASONEX) 50 MCG/ACT nasal spray Frequency:QD   Dosage:50   MCG  Instructions:  Note:Dose: 50 MCG     No current facility-administered medications for this visit.     VITAL SIGNS: BP (!) 161/102   Pulse 93   Temp 97.6 F (36.4 C) (Tympanic)   Resp 20   Ht _0  (1.854 m)    Wt 152 lb (68.9 kg)   BMI 20.05 kg/m  Filed Weights   09/14/18 0830  Weight: 152 lb (68.9 kg)    Estimated body mass index is 20.05 kg/m as calculated from the following:   Height as of this encounter: _1  (1.854 m).   Weight as of this encounter: 152 lb (68.9 kg).  LABS: CBC:    Component Value Date/Time   WBC 6.0 08/14/2018 1014   WBC 4.8 03/07/2018 1333   HGB 12.3 (L) 08/14/2018 1014   HCT 33.3 (L) 08/14/2018 1014   PLT 218 08/14/2018 1014   MCV 108 (H) 08/14/2018 1014   MCV 111 (H) 07/09/2014 1411   NEUTROABS 4.3 08/14/2018 1014   NEUTROABS 2.2 07/09/2014 1411   LYMPHSABS 1.0 08/14/2018 1014   LYMPHSABS 1.6 07/09/2014 1411   MONOABS 0.5 03/07/2018 1333   MONOABS 0.3 07/09/2014 1411   EOSABS 0.0 08/14/2018 1014   EOSABS 0.1 07/09/2014 1411   BASOSABS 0.1 08/14/2018 1014   BASOSABS 0.0 07/09/2014 1411   Comprehensive Metabolic Panel:    Component Value Date/Time   NA 139 08/14/2018 1014   NA 139 01/07/2014 1044   K 3.2 (L) 08/14/2018 1014   K 4.4 01/07/2014 1044   CL 104 08/14/2018 1014   CL 102 01/07/2014 1044   CO2 27 08/14/2018 1014   CO2 30 01/07/2014 1044   BUN 12 08/14/2018 1014   BUN 15 01/07/2014 1044   CREATININE 1.31 (H) 08/14/2018 1014   CREATININE 1.03 07/09/2014 1411   GLUCOSE 115 (H) 08/14/2018 1014   GLUCOSE 113 (H) 01/07/2014 1044   CALCIUM 9.2 08/14/2018 1014   CALCIUM 9.3 01/07/2014 1044   AST 22 08/14/2018 1014   AST 45 (H) 07/09/2014 1411   ALT 26 08/14/2018 1014   ALT 25 07/09/2014 1411   ALKPHOS 118 08/14/2018 1014   ALKPHOS 78 07/09/2014 1411   BILITOT 0.4 08/14/2018 1014   BILITOT 0.4 07/09/2014 1411   PROT 7.1 08/14/2018 1014   PROT 7.5 07/09/2014 1411   ALBUMIN 4.2 08/14/2018 1014   ALBUMIN 3.8 07/09/2014 1411    RADIOGRAPHIC STUDIES: No results found.  PERFORMANCE STATUS (ECOG) : 1 - Symptomatic but completely ambulatory  Review of Systems As noted above. Otherwise, a complete review of systems is  negative.  Physical Exam General: NAD, frail appearing, thin, sunken cheeks Cardiovascular: regular rate and rhythm Pulmonary: clear ant fields Abdomen: soft, nontender, + bowel sounds Extremities: no edema, no joint deformities Skin: no rashes Neurological: Weakness but otherwise nonfocal  IMPRESSION: I met today with patient and his partner in the clinic.  Patient says his pain has been stable over the past month.  He wants to start weaning  himself off the opioids.  Historically, patient is taking MS Contin 15 mg twice daily and oxycodone 10 mg 4 times daily.  He has halved the dose of the oxycodone at bedtime and plans to start taking it 3 times daily.  We talked about slow weaning strategies and I recommended that he can try backing down to 3 daily doses of oxycodone 5 mg.  If he tolerates that, he can go to twice daily dosing and then eventually take it just as needed.  Patient was started on gabapentin but says this caused him to be overly sedated and he has since stopped it.  Patient almost certainly has a neuropathic component to his pain.  His pain is described as burning at the site of his surgical scar on the neck.  I recommended trial of capsaicin cream.  We also talked about use of acupuncture.  Patient has a history of opioid-induced constipation.  This is currently stable with use of MiraLAX every other day.  He does have a history of internal and external hemorrhoids that have been somewhat uncomfortable.  He requests referral to see GI.  Will facilitate referral.  Patient is being followed actively by ID.  He reports having an undetectable viral load and appears to be compliant with his medications.  Patient's partner assists him with filling a pillbox.  Patient says ID plans to help him establish with a PCP.  Patient is also followed actively by psychiatry and reports stable moods and anxiety.  We talked about his previous losses from HIV.  He is lost many friends to the  illness over the years.  However, patient says he is coping well.  Pain contract was established by Ander Purpura, NP on 08/18/2018.  UDS was checked at that time and was appropriately positive for opiates.  We discussed ACP. He has an old ACP but plans to update it. He says he would want his partner to be his HCPOA and then his niece to be a backup.   PLAN: 1.  Continue MS Contin Q12H and oxycodone 5-38m TID PRN. Weaning parameters discussed with patient.  2.  Continue prophylactic bowel regimen 3.  Recommend trial of capsaicin cream for neuropathic pain 4.  We will make referral to GI 5.  Patient to establish with a PCP 6.  RTC as needed   Patient expressed understanding and was in agreement with this plan. He also understands that He can call clinic at any time with any questions, concerns, or complaints.    Time Total: 30 minutes  Visit consisted of counseling and education dealing with the complex and emotionally intense issues of symptom management and palliative care in the setting of serious and potentially life-threatening illness.Greater than 50%  of this time was spent counseling and coordinating care related to the above assessment and plan.  Signed by: JAltha Harm PhD, NP-C [phone](Work Cell)

## 2018-09-22 ENCOUNTER — Other Ambulatory Visit: Payer: Self-pay | Admitting: Nurse Practitioner

## 2018-09-22 ENCOUNTER — Ambulatory Visit: Payer: Medicare Other | Admitting: Cardiology

## 2018-09-22 ENCOUNTER — Telehealth: Payer: Self-pay | Admitting: Nurse Practitioner

## 2018-09-22 DIAGNOSIS — C8590 Non-Hodgkin lymphoma, unspecified, unspecified site: Principal | ICD-10-CM

## 2018-09-22 DIAGNOSIS — B20 Human immunodeficiency virus [HIV] disease: Secondary | ICD-10-CM

## 2018-09-22 LAB — GENOSURE PRIME (GSPRIL)

## 2018-09-22 LAB — QUANT, RNA PCR
HIV-1 RNA by PCR: <200 copies/mL
log10 HIV-1 RNA: UNDETERMINED log10copy/mL

## 2018-09-22 MED ORDER — MORPHINE SULFATE ER 15 MG PO TBCR: 15.0000 mg | EXTENDED_RELEASE_TABLET | Freq: Two times a day (BID) | ORAL | 0 refills | Status: DC

## 2018-09-22 MED ORDER — OXYCODONE HCL 10 MG PO TABS: 5.0000 mg | ORAL_TABLET | Freq: Three times a day (TID) | ORAL | 0 refills | Status: DC | PRN

## 2018-09-22 NOTE — Telephone Encounter (Signed)
Narcotic refill request: MS Contin & Oxycodone  As mandated by the [place] STOP Act (Strengthen Opioid Misuse Prevention), the [place] reviewed prior to consideration of refills as below:    Given a history of oncology diagnosis, this patient has chronic pain associated with his diagnosis and/or treatments. Benefits versus risks associated with continued therapy considered. Will continue pain management with opiates & opioids as prescribed.   Patient educated that medications should not be bitten, chewed, or crushed. Additionally, safety precautions reviewed. Patient verbalized understanding that medications should not be sold or shared, taken with alcohol, or used while driving. He has been made aware of the side effects of using this medication. Patient understands that this medication can cause CNS depression, increase his risk of falls, and even lead to overdose that may result in death, if used outside of the parameters that he and I discussed.   With all of this in mind, he accepts the risks and responsibilities associated with therapy and elects to continue to use the prescribed interventions. As supervising physician, Dr. Rogue Bussing, agrees that continuation of opiate & opioid therapy is medically appropriate at this time and agrees to provide continual monitoring, including urine/blood drug screens, as indicated.   Refill prescription sent electronically using Imprivata secure transmission to requested pharmacy:  1. MS Contin 15 mg tab q12h. Dispense 60 2. Oxycodone 10 mg (0.5-1 tab q8h prn). Dispense 90.   Beckey Rutter, DNP, AGNP-C [place] at [facility] [phone] (work cell) [phone] (office)

## 2018-09-22 NOTE — Telephone Encounter (Signed)
Called patient to discuss continued dose reduction/withdrawal from pain medications. He advises that he has decreased oxycodone use to 1 tablet (10mg ) 3 times a day versus 4 times a day.  He has also decreased MS Contin use to 1 tablet 2 times a day versus 1 tablet in the morning and 2 tablets at night.  I advised Steven Compton that drug screen from 08/18/2018 had unexpected finding of being negative for oxycodone.  This was discussed and confirmed by [name], toxicologist from [facility]. Patient today states that he had run out of medication and this may have caused the negative result.  He was very surprised by this and felt this was not accurate. He reports compliance and explains he is interested in continuing to reduce his use and continue to taper down. We again discussed that taking high doses of opioids may not provide good relief from pain over long periods of time and that many people who try a gradual taper to lower doses reports less pain, better mood, function, and overall quality of life.  He reports that his pain is still well controlled at decreased dose.  We discussed that after consulting with Dr. Rogue Bussing and Merrily Pew Borders (Palliative Care) our recommendation is that the dose reduction and withdrawal from pain medications can be done more safely and better at a pain clinic who specializes in management of chronic pain and these tapers. He acknowledged understanding and agreed. I offered referral to [facility] which patient was agreeable to. Advised patient that Dr. Rogue Bussing recommends continuing pain medication at reduced dose in interim and continuing to gradually taper his use as we previously discussed. Refills will be provided today. [name] to coordinate referral to pain management.

## 2018-09-25 ENCOUNTER — Encounter: Payer: Self-pay | Admitting: Infectious Diseases

## 2018-09-25 ENCOUNTER — Other Ambulatory Visit: Payer: Self-pay | Admitting: Licensed Clinical Social Worker

## 2018-09-25 DIAGNOSIS — B20 Human immunodeficiency virus [HIV] disease: Secondary | ICD-10-CM

## 2018-09-25 NOTE — Progress Notes (Signed)
Lab orders only 

## 2018-09-25 NOTE — Progress Notes (Signed)
On 1/2 pt was sent for [employer] blood test but the lab instead did [employer]- I spoke with Management consultant and they relaized the mistake of the lab and the patient will not be charged for this test. Pt has been called to come back for the correct test

## 2018-09-26 ENCOUNTER — Other Ambulatory Visit
Admission: RE | Admit: 2018-09-26 | Discharge: 2018-09-26 | Disposition: A | Discharge status: Home / Self Care | Payer: Medicare Other | Source: Ambulatory Visit | Attending: Infectious Diseases | Admitting: Infectious Diseases

## 2018-09-26 DIAGNOSIS — B20 Human immunodeficiency virus [HIV] disease: Secondary | ICD-10-CM | POA: Diagnosis present

## 2018-10-11 LAB — MISC LABCORP TEST (SEND OUT): Labcorp test code: 551776

## 2018-10-24 DIAGNOSIS — C44622 Squamous cell carcinoma of skin of right upper limb, including shoulder: Secondary | ICD-10-CM | POA: Diagnosis not present

## 2018-10-24 DIAGNOSIS — L578 Other skin changes due to chronic exposure to nonionizing radiation: Secondary | ICD-10-CM | POA: Diagnosis not present

## 2018-10-24 DIAGNOSIS — D485 Neoplasm of uncertain behavior of skin: Secondary | ICD-10-CM | POA: Diagnosis not present

## 2018-10-24 DIAGNOSIS — L570 Actinic keratosis: Secondary | ICD-10-CM | POA: Diagnosis not present

## 2018-10-24 DIAGNOSIS — L820 Inflamed seborrheic keratosis: Secondary | ICD-10-CM | POA: Diagnosis not present

## 2018-10-26 ENCOUNTER — Ambulatory Visit: Payer: Medicare Other | Attending: Infectious Diseases | Admitting: Infectious Diseases

## 2018-10-26 ENCOUNTER — Encounter: Payer: Self-pay | Admitting: Infectious Diseases

## 2018-10-26 ENCOUNTER — Other Ambulatory Visit: Payer: Self-pay | Admitting: Nurse Practitioner

## 2018-10-26 VITALS — BP 145/88 | HR 104 | Temp 97.5°F | Wt 156.2 lb

## 2018-10-26 DIAGNOSIS — F339 Major depressive disorder, recurrent, unspecified: Secondary | ICD-10-CM

## 2018-10-26 DIAGNOSIS — I10 Essential (primary) hypertension: Secondary | ICD-10-CM | POA: Diagnosis not present

## 2018-10-26 DIAGNOSIS — B20 Human immunodeficiency virus [HIV] disease: Secondary | ICD-10-CM

## 2018-10-26 DIAGNOSIS — C8590 Non-Hodgkin lymphoma, unspecified, unspecified site: Principal | ICD-10-CM

## 2018-10-26 DIAGNOSIS — Z87891 Personal history of nicotine dependence: Secondary | ICD-10-CM

## 2018-10-26 DIAGNOSIS — Z888 Allergy status to other drugs, medicaments and biological substances status: Secondary | ICD-10-CM

## 2018-10-26 DIAGNOSIS — R911 Solitary pulmonary nodule: Secondary | ICD-10-CM | POA: Diagnosis not present

## 2018-10-26 DIAGNOSIS — Z79891 Long term (current) use of opiate analgesic: Secondary | ICD-10-CM

## 2018-10-26 DIAGNOSIS — Z79899 Other long term (current) drug therapy: Secondary | ICD-10-CM

## 2018-10-26 DIAGNOSIS — D013 Carcinoma in situ of anus and anal canal: Secondary | ICD-10-CM | POA: Diagnosis not present

## 2018-10-26 DIAGNOSIS — F419 Anxiety disorder, unspecified: Secondary | ICD-10-CM

## 2018-10-26 DIAGNOSIS — B59 Pneumocystosis: Secondary | ICD-10-CM

## 2018-10-26 DIAGNOSIS — E785 Hyperlipidemia, unspecified: Secondary | ICD-10-CM

## 2018-10-26 DIAGNOSIS — B459 Cryptococcosis, unspecified: Secondary | ICD-10-CM

## 2018-10-26 DIAGNOSIS — Z8572 Personal history of non-Hodgkin lymphomas: Secondary | ICD-10-CM

## 2018-10-26 MED ORDER — BICTEGRAVIR-EMTRICITAB-TENOFOV 50-200-25 MG PO TABS: 1.0000 | ORAL_TABLET | Freq: Every day | ORAL | 1 refills | Status: DC

## 2018-10-26 NOTE — Patient Instructions (Signed)
Today you are here to change your regimen- currently on combivir, viread and [place].  We did resustance test and we can start you on Biktarvy - on epill a day- labs in 4 weeks and follow up 6 weeks Side effects of medicine are given in a separate sheet

## 2018-10-26 NOTE — Progress Notes (Signed)
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 Steven Compton is a 58 y.o. with a history of AIDS, diagnoed in 26 Started out in [place] and then Dr.Blocker and then Dr.Fitzgerald .  Last labs from June 2019 177 ( 12%) on 02/24/18, VL < 20 On Combivir, kaletra and viread since 2005,   Nadir Cd4 <100 OI pneumocystosis/cryptococcosis ( pt not ware of the latter) HAARt history Since 2006 Combivir, kaletra and tenofovir Acquired thru sex with men Genotype- NA ?  Medical History  HTN ( says he was treated a long time ago but not on any meds as it was fine) HIV NH Lymphoma in remission-chemo/radiation 2006 Hyperlipidemia Carcinoma in situ of anal canal Incidental lung nodule Depression/anxiety pneumocystosis Cryptococcis Past Medical History:  Diagnosis Date  . AIDS (acquired immune deficiency syndrome) ([place])   . AIDS-related lymphoma ([place]) 03/04/2015  . Anxiety   . Carcinoma in situ of anal canal   . 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 Depression   . GERD (gastroesophageal reflux disease)   . HIV (human immunodeficiency virus infection) ([place])   . Hyperlipidemia   . Lesion of left lung May 2012  . Peripheral neuropathy   . Pneumocystosis [facility])     Past Surgical History:  Procedure Laterality Date  . APPENDECTOMY    . COLONOSCOPY WITH PROPOFOL N/A 06/28/2018   Procedure: COLONOSCOPY WITH PROPOFOL;  Surgeon: Toledo, Benay Pike, MD;  Location: [facility] ENDOSCOPY;  Service: Gastroenterology;  Laterality: N/A;  . LUMBAR DISC SURGERY     cervicle/lumbar multiple levels  . percutaneous biopsy lung      SH Former  smoker Occasional alcohol and marijuana No hard drugs Lives with his significant other for the past 30 yrs and he is not positive   No family history on file. Allergies  Allergen Reactions  . Abacavir Nausea And Vomiting, Anaphylaxis and Rash  . Zalcitabine Anaphylaxis, Rash and Other (See Comments)    Patient unsure of this med.  . Duloxetine Hcl Nausea And Vomiting and Other (See Comments)    Possible seizure like event  . Duloxetine Nausea And Vomiting and Other (See Comments)    Possible seizure like event Possible seizure like event   Current Meds Alprazolam Mirtazapine Quetiapine Oxycodone Morphine sulfate Promethazine combivir viread kaletra Ibuprofen as and when needed levocetrizine Nasonex Meclizine- PRN- once or twice a month proair Ketoconazole 2% Omeprazole Loratadine 10mg   vitamin   ?    REVIEW OF SYSTEMS:  Const: negative fever, negative chills, negative weight loss Eyes: negative diplopia or visual changes, negative eye pain ENT: negative coryza, negative sore throat Resp: negative cough, hemoptysis, dyspnea Cards: negative for chest pain, palpitations, lower extremity edema GU: negative for frequency, dysuria and hematuria Skin: negative for rash and pruritus Heme: negative for easy bruising and gum/nose bleeding MS: has pain in th surgical site in his neck Neurolo:negative for headaches, occasional dizziness, vertigo, memory problems  Psych: anxiety, depression   Objective:  VITALS:  Wt 156 lb 4 oz (70.9 kg)   BMI 20.61 kg/m  PHYSICAL EXAM:  General: Alert, cooperative, no distress, appears stated age. Thin built Severe  lipoatrophy of the face Head: Normocephalic, without obvious abnormality, atraumatic. Eyes: Conjunctivae clear, anicteric sclerae. Pupils are equal Nose: Nares normal. No drainage or sinus tenderness. Throat: Lips, mucosa, and tongue normal. No Thrush Neck: Supple, symmetrical, scar on th left side , thyroid: non  tender no carotid bruit and no JVD. Back: No CVA tenderness. Lungs: Clear to auscultation bilaterally. No Wheezing or Rhonchi. No rales. Heart: Regular rate and rhythm, no murmur, rub or gallop. Abdomen: Soft, non-tender,not distended. Bowel sounds normal. No masses Extremities: Extremities normal, atraumatic, no cyanosis. No edema. No clubbing Skin: No rashes or lesions. Not Jaundiced Lymph: Cervical, supraclavicular normal. Neurologic: Grossly non-focal   Health maintenance Vaccination pneumovac- 23 on 06/01/2013 Prevnar-13-received 06/18/16 HepB- may need- will check surface antibody HepA TdaP Flu Herpes zoster-  ______________________ labs RPR-08/14/18 NR HEPC ab-08/14/18 NR Hepatits A IgM neg from 08/14/18 Lipid-08/14/18 TC 124, HDL 26, LDL 69 and TGL 146 CMv TOXO -IGG/IgM HIV VL < 20 Cd4-189 ( 18%) quantiferon Gold-08/14/18 NR GC/CHL [id number]-Negative -08/14/18 Genosure archive done 09/26/18 - see attached       HIV antibody  Preventive  Dental Colonoscopy Opthal Anal /  Impression/Recommendation ?58 y.o. with a history of AIDS, diagnosed in [place]- on combivir ( NRTI) , kaletra ( PI) and viread since 2005- 100% adherent- Vl is undetectable but Cd4 < 200 He has severe lipoatrophy of the face and it could be from AZT in combivir- He has been on many drugs before that but does not remember - some drugs are familiar like AZT, sustiva, viracept,  combivir and kaletra are no longer first line drugs to treat HIV. genosure archive done on 09/26/18 shows M184V, K 103N and a few PI minor mutation no integrase mutation. Will change the current regimen to Biktarvy ( bictegravir+FTC+TAF) Gave material to read about the med-discussed side effetcs  Hyperlipidemia- on atorvastatin   Lesion on the rt hand ? Keratoacanthoma ?? BCC- had surgery last week and pathology pending   H/o NHL in remission  Carcinoma in situ- anal- need to follow up with new anal pap  On  MS contin and oxycodone for pain from his oncologist  On antidepressants   Health maintenance  Updated today Need to update vaccination status  ___________________________________________________ Discussed with patient, follow up in 6 weeks Labs 2 weeks before

## 2018-10-27 MED ORDER — OXYCODONE HCL 10 MG PO TABS: 5.0000 mg | ORAL_TABLET | Freq: Three times a day (TID) | ORAL | 0 refills | Status: AC | PRN

## 2018-10-27 MED ORDER — MORPHINE SULFATE ER 15 MG PO TBCR: 15.0000 mg | EXTENDED_RELEASE_TABLET | Freq: Two times a day (BID) | ORAL | 0 refills | Status: AC

## 2018-10-27 NOTE — Telephone Encounter (Signed)
Patient has not yet established care with Pain Management. Advised patient that he will need to fill out new patient paperwork online through their website. He has continued to attempt to decrease his pain medication usage but complains of pain in his legs at night. Advised patient that this is unlikely related to his history of malignancy and that he should be evaluated by his PCP for this pain. He agrees.   Refill of MS Contin and Oxycodone requested today.   As mandated by the [place] STOP Act (Strengthen Opioid Misuse Prevention), the [place] Controlled Substance Reporting System ([place]) was reviewed for this patient.  Below is the past 63-months of controlled substance prescriptions as displayed by the registry.  I have personally consulted with my supervising physician, Dr. Rogue Bussing, who agrees that continuation of opiate therapy is medically appropriate at this time and agrees to provide continual monitoring, including urine/blood drug screens, as indicated. Prescription sent electronically using Impriv[employer] secure transmission to requested pharmacy.   Patient has pain contract with [facility].   [place] Reviewed:     Beckey Rutter, DNP, AGNP-C [place] at [facility] [phone] (work cell) [phone] (office) 10/27/18 3:27 PM

## 2018-11-14 ENCOUNTER — Encounter: Admit: 2018-11-14 | Discharge: 2018-11-15 | Payer: MEDICARE

## 2018-11-14 DIAGNOSIS — I10 Essential (primary) hypertension: Principal | ICD-10-CM

## 2018-11-14 DIAGNOSIS — S060X9A Concussion with loss of consciousness of unspecified duration, initial encounter: Principal | ICD-10-CM

## 2018-11-14 DIAGNOSIS — F3342 Major depressive disorder, recurrent, in full remission: Principal | ICD-10-CM

## 2018-11-14 DIAGNOSIS — G894 Chronic pain syndrome: Principal | ICD-10-CM

## 2018-11-14 DIAGNOSIS — B20 Human immunodeficiency virus [HIV] disease: Principal | ICD-10-CM

## 2018-11-14 DIAGNOSIS — C801 Malignant (primary) neoplasm, unspecified: Principal | ICD-10-CM

## 2018-11-14 DIAGNOSIS — F329 Major depressive disorder, single episode, unspecified: Principal | ICD-10-CM

## 2018-11-14 MED ORDER — MIRTAZAPINE 30 MG TABLET
ORAL_TABLET | Freq: Every evening | ORAL | 1 refills | 0 days | Status: CP
Start: 2018-11-14 — End: 2019-02-20

## 2018-11-14 MED ORDER — ALPRAZOLAM 1 MG TABLET
ORAL_TABLET | Freq: Four times a day (QID) | ORAL | 3 refills | 0.00000 days | Status: CP
Start: 2018-11-14 — End: 2019-02-20

## 2018-11-14 MED ORDER — QUETIAPINE 200 MG TABLET
ORAL_TABLET | Freq: Every evening | ORAL | 1 refills | 0 days | Status: CP
Start: 2018-11-14 — End: 2019-02-20

## 2018-11-21 ENCOUNTER — Other Ambulatory Visit: Payer: Medicare Other | Admitting: Infectious Diseases

## 2018-11-21 ENCOUNTER — Ambulatory Visit: Payer: Medicare Other | Attending: Infectious Diseases

## 2018-11-21 DIAGNOSIS — B20 Human immunodeficiency virus [HIV] disease: Secondary | ICD-10-CM

## 2018-11-21 NOTE — Progress Notes (Signed)
Labs completed

## 2018-11-24 ENCOUNTER — Other Ambulatory Visit: Payer: Self-pay | Admitting: Licensed Clinical Social Worker

## 2018-11-24 DIAGNOSIS — B20 Human immunodeficiency virus [HIV] disease: Secondary | ICD-10-CM

## 2018-11-28 ENCOUNTER — Ambulatory Visit: Payer: Medicare Other | Attending: Infectious Diseases

## 2018-11-28 ENCOUNTER — Other Ambulatory Visit: Payer: Self-pay

## 2018-11-28 ENCOUNTER — Other Ambulatory Visit: Payer: Self-pay | Admitting: Infectious Diseases

## 2018-11-28 DIAGNOSIS — B20 Human immunodeficiency virus [HIV] disease: Secondary | ICD-10-CM

## 2018-11-28 NOTE — Progress Notes (Signed)
Patient here for lab only visit

## 2018-12-04 ENCOUNTER — Telehealth: Payer: Self-pay | Admitting: *Deleted

## 2018-12-04 NOTE — Telephone Encounter (Signed)
Returned patient's call. He says that he has continued to cut down his use of pain medication to MS Contin once a day and 1-2 oxycodone per day. He says that he has not initiated care with pain management nor has he established primary care. He has seen his infectious disease doctor whom he says is not able to manage his pain medications. He says that he does not have transportation and with COVID-19 he does not want to go to doctor's appointments currently and is requesting that we continue filling his pain medication as he self-decreases use. I advised him that per our last discussion, he had an abnormal drug screen where he was inappropriately negative for oxycodone and I advised that we are not specialists in managing dose reductions though I do applaud his efforts. With this in mind, I again encouraged him to schedule an appointment to establish primary care services and advised him that I will discuss with both Dr. Rogue Bussing and Billey Chang, palliative care to see if they would be comfortable overseeing his dose reductions.

## 2018-12-04 NOTE — Telephone Encounter (Signed)
Patient called requesting that Lauren call him regarding his pain medicine. Please return call [phone]

## 2018-12-05 ENCOUNTER — Telehealth: Payer: Self-pay | Admitting: Hospice and Palliative Medicine

## 2018-12-05 NOTE — Telephone Encounter (Signed)
Thank you, all! GB

## 2018-12-05 NOTE — Telephone Encounter (Signed)
I spoke with [facility]. Patient was called today by them and offered an appointment later this week. He reportedly told them that he did not want to leave home due to concerns regarding COVID-19. He was offered several alternative appointments and accepted an appointment on 4/28 at [facility] in [place].

## 2018-12-05 NOTE — Telephone Encounter (Signed)
I called and spoke with [facility]. They plan to call the patient today to try to schedule an appointment.

## 2018-12-07 ENCOUNTER — Ambulatory Visit: Payer: Medicare Other | Admitting: Infectious Diseases

## 2018-12-14 LAB — HIV-1 RNA QUANT-NO REFLEX-BLD: HIV-1 RNA Viral Load: <20 copies/mL

## 2018-12-14 LAB — SPECIMEN STATUS REPORT

## 2018-12-19 ENCOUNTER — Other Ambulatory Visit: Payer: Self-pay | Admitting: Licensed Clinical Social Worker

## 2018-12-19 MED ORDER — BICTEGRAVIR-EMTRICITAB-TENOFOV 50-200-25 MG PO TABS: 1.0000 | ORAL_TABLET | Freq: Every day | ORAL | 3 refills | Status: DC

## 2018-12-26 ENCOUNTER — Other Ambulatory Visit: Payer: Self-pay

## 2018-12-26 ENCOUNTER — Ambulatory Visit: Payer: Medicare Other | Attending: Infectious Diseases | Admitting: Infectious Diseases

## 2018-12-26 DIAGNOSIS — B20 Human immunodeficiency virus [HIV] disease: Secondary | ICD-10-CM | POA: Diagnosis not present

## 2018-12-26 NOTE — Progress Notes (Signed)
Virtual TELE visit by [email or web address] The purpose of this virtual visit is to provide medical care while limiting exposure to the novel coronavirus (COVID19) for both patient and office staff.   Consent was obtained for Televisit:  Yes.   Answered questions that patient had about telehealth interaction:  Yes.     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 Pt has not had any side effects from the new medicine- other than nausea for the first couple of days. No diarrhea, abdominal pain, no rash, no fever or headache. He has gained 4 pounds of weight HE says he has restless legs since he stopped morphine and oxycodone a month ago ( tapered it). Medications reviewed with patient Current Meds Biktarvy Alprazolam Mirtazapine Quetiapine levocetrizine Nasonex Meclizine- PRN- once or twice a month proair Omeprazole vitamin   REVIEW OF SYSTEMS:  Const: negative fever, negative chills, has gained 4 pounds in 1 month Eyes: negative diplopia or visual changes, negative eye pain ENT: negative coryza, negative sore throat Resp: negative cough, hemoptysis, dyspnea Cards: negative for chest pain, palpitations, lower extremity edema GU: negative for frequency, dysuria and hematuria Skin: negative for rash and pruritus Heme: negative for easy bruising and gum/nose bleeding MS:leg pain at night Neurolo:negative for headaches, occasional dizziness, vertigo, memory problems  Psych: has anxiety, depression   Physical examination- not done due to virtual visit O/e pt looks well, no distress, oriented X  3,    Impression/Recommendation ?58 y.o. with a history of AIDS, diagnosed in [place]- currently on Elliott which is a combination of bictegravir( integrase inhibitor)+FTC+TAF. He started this regimen 2 months ago in Feb 2020 and is doing well. Last Vl from 11/28/18 undetectable Cd4 from Dec 2019 is 180 Will do labs once the COVID 19 settles down. Pt is  immunocompromised and and he prefers not to come to hospitals or labs  Currently which I totally agree     H/o NHL in remission  Carcinoma in situ- anal- need to follow up with new anal pap  On MS contin and oxycodone for pain from his oncologist- he has tapered and stopped both the meds  On antidepressants  Restless legs- asked him to hydrate well, warm socks and try tylenol at night time to see whether that would help  Follow up labs in 3 months( CBC/CMP/HIV RNA/CD4)

## 2019-01-08 DIAGNOSIS — R203 Hyperesthesia: Secondary | ICD-10-CM | POA: Diagnosis not present

## 2019-01-08 DIAGNOSIS — R234 Changes in skin texture: Secondary | ICD-10-CM | POA: Diagnosis not present

## 2019-01-08 DIAGNOSIS — C44622 Squamous cell carcinoma of skin of right upper limb, including shoulder: Secondary | ICD-10-CM | POA: Diagnosis not present

## 2019-02-13 ENCOUNTER — Inpatient Hospital Stay: Payer: Medicare Other | Admitting: Internal Medicine

## 2019-02-13 ENCOUNTER — Inpatient Hospital Stay: Payer: Medicare Other

## 2019-02-20 ENCOUNTER — Telehealth: Admit: 2019-02-20 | Discharge: 2019-02-21 | Payer: MEDICARE

## 2019-02-20 DIAGNOSIS — F419 Anxiety disorder, unspecified: Secondary | ICD-10-CM

## 2019-02-20 DIAGNOSIS — F3342 Major depressive disorder, recurrent, in full remission: Principal | ICD-10-CM

## 2019-02-20 MED ORDER — ALPRAZOLAM 1 MG TABLET
ORAL_TABLET | Freq: Four times a day (QID) | ORAL | 5 refills | 0.00000 days | Status: CP
Start: 2019-02-20 — End: ?

## 2019-02-20 MED ORDER — MIRTAZAPINE 30 MG TABLET
ORAL_TABLET | Freq: Every evening | ORAL | 1 refills | 0 days | Status: CP
Start: 2019-02-20 — End: ?

## 2019-02-20 MED ORDER — QUETIAPINE 200 MG TABLET
ORAL_TABLET | Freq: Every evening | ORAL | 1 refills | 0.00000 days | Status: CP
Start: 2019-02-20 — End: ?

## 2019-02-21 DIAGNOSIS — C44622 Squamous cell carcinoma of skin of right upper limb, including shoulder: Secondary | ICD-10-CM | POA: Diagnosis not present

## 2019-02-21 DIAGNOSIS — Z85828 Personal history of other malignant neoplasm of skin: Secondary | ICD-10-CM | POA: Diagnosis not present

## 2019-02-21 DIAGNOSIS — C4492 Squamous cell carcinoma of skin, unspecified: Secondary | ICD-10-CM

## 2019-02-21 HISTORY — DX: Squamous cell carcinoma of skin, unspecified: C44.92

## 2019-03-06 ENCOUNTER — Ambulatory Visit: Payer: Medicare Other | Admitting: Internal Medicine

## 2019-03-06 ENCOUNTER — Other Ambulatory Visit: Payer: Medicare Other

## 2019-04-03 DIAGNOSIS — C44622 Squamous cell carcinoma of skin of right upper limb, including shoulder: Secondary | ICD-10-CM | POA: Diagnosis not present

## 2019-04-04 DIAGNOSIS — C44622 Squamous cell carcinoma of skin of right upper limb, including shoulder: Secondary | ICD-10-CM | POA: Diagnosis not present

## 2019-04-16 DIAGNOSIS — K649 Unspecified hemorrhoids: Secondary | ICD-10-CM | POA: Diagnosis not present

## 2019-04-16 DIAGNOSIS — C8590 Non-Hodgkin lymphoma, unspecified, unspecified site: Secondary | ICD-10-CM | POA: Diagnosis not present

## 2019-04-16 DIAGNOSIS — Z1211 Encounter for screening for malignant neoplasm of colon: Secondary | ICD-10-CM | POA: Diagnosis not present

## 2019-04-16 DIAGNOSIS — D013 Carcinoma in situ of anus and anal canal: Secondary | ICD-10-CM | POA: Diagnosis not present

## 2019-04-23 ENCOUNTER — Other Ambulatory Visit: Payer: Self-pay | Admitting: Licensed Clinical Social Worker

## 2019-04-23 DIAGNOSIS — B20 Human immunodeficiency virus [HIV] disease: Secondary | ICD-10-CM

## 2019-04-23 MED ORDER — BIKTARVY 50-200-25 MG PO TABS: 1.0000 | ORAL_TABLET | Freq: Every day | ORAL | 3 refills | Status: DC

## 2019-05-16 DIAGNOSIS — L578 Other skin changes due to chronic exposure to nonionizing radiation: Secondary | ICD-10-CM | POA: Diagnosis not present

## 2019-05-16 DIAGNOSIS — Z85828 Personal history of other malignant neoplasm of skin: Secondary | ICD-10-CM | POA: Diagnosis not present

## 2019-05-22 ENCOUNTER — Encounter
Admit: 2019-05-22 | Discharge: 2019-05-23 | Payer: MEDICARE | Attending: Student in an Organized Health Care Education/Training Program | Primary: Student in an Organized Health Care Education/Training Program

## 2019-07-24 ENCOUNTER — Encounter
Admit: 2019-07-24 | Discharge: 2019-07-25 | Payer: MEDICARE | Attending: Student in an Organized Health Care Education/Training Program | Primary: Student in an Organized Health Care Education/Training Program

## 2019-08-26 DIAGNOSIS — F3342 Major depressive disorder, recurrent, in full remission: Principal | ICD-10-CM

## 2019-08-26 DIAGNOSIS — F419 Anxiety disorder, unspecified: Principal | ICD-10-CM

## 2019-08-27 MED ORDER — ALPRAZOLAM 1 MG TABLET
ORAL_TABLET | Freq: Four times a day (QID) | ORAL | 5 refills | 30.00000 days | Status: CP
Start: 2019-08-27 — End: ?

## 2019-08-31 ENCOUNTER — Other Ambulatory Visit: Payer: Self-pay | Admitting: Infectious Diseases

## 2019-08-31 DIAGNOSIS — B20 Human immunodeficiency virus [HIV] disease: Secondary | ICD-10-CM

## 2019-08-31 MED ORDER — BIKTARVY 50-200-25 MG PO TABS: 1.0000 | ORAL_TABLET | Freq: Every day | ORAL | 5 refills | Status: AC

## 2019-09-21 DIAGNOSIS — F3342 Major depressive disorder, recurrent, in full remission: Principal | ICD-10-CM

## 2019-09-21 DIAGNOSIS — F419 Anxiety disorder, unspecified: Principal | ICD-10-CM

## 2019-09-21 MED ORDER — MIRTAZAPINE 30 MG TABLET
ORAL_TABLET | Freq: Every evening | ORAL | 1 refills | 90 days | Status: CP
Start: 2019-09-21 — End: ?

## 2019-10-16 ENCOUNTER — Encounter
Admit: 2019-10-16 | Discharge: 2019-10-17 | Payer: MEDICARE | Attending: Student in an Organized Health Care Education/Training Program | Primary: Student in an Organized Health Care Education/Training Program

## 2019-10-16 DIAGNOSIS — F411 Generalized anxiety disorder: Principal | ICD-10-CM

## 2019-10-16 DIAGNOSIS — F1320 Sedative, hypnotic or anxiolytic dependence, uncomplicated: Principal | ICD-10-CM

## 2019-10-16 DIAGNOSIS — F3341 Major depressive disorder, recurrent, in partial remission: Principal | ICD-10-CM

## 2019-10-16 DIAGNOSIS — Z79899 Other long term (current) drug therapy: Principal | ICD-10-CM

## 2019-10-25 DIAGNOSIS — Z6822 Body mass index (BMI) 22.0-22.9, adult: Secondary | ICD-10-CM | POA: Diagnosis not present

## 2019-10-25 DIAGNOSIS — K645 Perianal venous thrombosis: Secondary | ICD-10-CM | POA: Diagnosis not present

## 2019-10-25 DIAGNOSIS — I10 Essential (primary) hypertension: Secondary | ICD-10-CM | POA: Diagnosis not present

## 2019-10-30 DIAGNOSIS — Z79899 Other long term (current) drug therapy: Secondary | ICD-10-CM | POA: Diagnosis not present

## 2019-11-19 DIAGNOSIS — K645 Perianal venous thrombosis: Principal | ICD-10-CM

## 2019-11-19 DIAGNOSIS — K629 Disease of anus and rectum, unspecified: Principal | ICD-10-CM

## 2019-12-01 ENCOUNTER — Ambulatory Visit: Payer: Medicare Other | Attending: Internal Medicine

## 2019-12-01 DIAGNOSIS — Z23 Encounter for immunization: Secondary | ICD-10-CM

## 2019-12-01 NOTE — Progress Notes (Signed)
   Covid-19 Vaccination Clinic  Name:  Steven Compton    MRN: [id number] DOB: 03/28/1961  12/01/2019  [name] was observed post Covid-19 immunization for 15 minutes without incident. He was provided with Vaccine Information Sheet and instruction to access the V-Safe system.   [name] was instructed to call 911 with any severe reactions post vaccine: Marland Kitchen Difficulty breathing  . Swelling of face and throat  . A fast heartbeat  . A bad rash all over body  . Dizziness and weakness   Immunizations Administered    Name Date Dose VIS Date Route   Pfizer COVID-19 Vaccine 12/01/2019  1:03 PM 0.3 mL 08/24/2019 Intramuscular   Manufacturer: [place]   Lot: [id number]   [place]: [id number]

## 2019-12-09 DIAGNOSIS — F419 Anxiety disorder, unspecified: Principal | ICD-10-CM

## 2019-12-09 DIAGNOSIS — F3342 Major depressive disorder, recurrent, in full remission: Principal | ICD-10-CM

## 2019-12-25 ENCOUNTER — Ambulatory Visit: Payer: Medicare Other | Attending: Internal Medicine

## 2019-12-25 DIAGNOSIS — Z23 Encounter for immunization: Secondary | ICD-10-CM

## 2019-12-25 NOTE — Progress Notes (Signed)
   Covid-19 Vaccination Clinic  Name:  Steven Compton    MRN: [id number] DOB: 1960/10/25  12/25/2019  [name] was observed post Covid-19 immunization for 15 minutes without incident. He was provided with Vaccine Information Sheet and instruction to access the V-Safe system.   [name] was instructed to call 911 with any severe reactions post vaccine: Marland Kitchen Difficulty breathing  . Swelling of face and throat  . A fast heartbeat  . A bad rash all over body  . Dizziness and weakness   Immunizations Administered    Name Date Dose VIS Date Route   Pfizer COVID-19 Vaccine 12/25/2019 12:53 PM 0.3 mL 08/24/2019 Intramuscular   Manufacturer: [place]   Lot: [id number]   [place]: [id number]

## 2020-01-08 ENCOUNTER — Ambulatory Visit: Admit: 2020-01-08 | Discharge: 2020-01-09 | Payer: MEDICARE

## 2020-01-08 ENCOUNTER — Encounter: Admit: 2020-01-08 | Discharge: 2020-01-09 | Payer: MEDICARE

## 2020-01-08 DIAGNOSIS — K645 Perianal venous thrombosis: Secondary | ICD-10-CM | POA: Diagnosis not present

## 2020-01-08 DIAGNOSIS — K6289 Other specified diseases of anus and rectum: Secondary | ICD-10-CM | POA: Diagnosis not present

## 2020-01-08 DIAGNOSIS — K629 Disease of anus and rectum, unspecified: Secondary | ICD-10-CM | POA: Diagnosis not present

## 2020-01-15 ENCOUNTER — Encounter
Admit: 2020-01-15 | Discharge: 2020-01-16 | Payer: MEDICARE | Attending: Student in an Organized Health Care Education/Training Program | Primary: Student in an Organized Health Care Education/Training Program

## 2020-01-15 DIAGNOSIS — F419 Anxiety disorder, unspecified: Principal | ICD-10-CM

## 2020-01-15 DIAGNOSIS — F3342 Major depressive disorder, recurrent, in full remission: Principal | ICD-10-CM

## 2020-01-15 DIAGNOSIS — F411 Generalized anxiety disorder: Principal | ICD-10-CM

## 2020-01-15 DIAGNOSIS — F3341 Major depressive disorder, recurrent, in partial remission: Principal | ICD-10-CM

## 2020-01-15 MED ORDER — MIRTAZAPINE 30 MG TABLET
ORAL_TABLET | Freq: Every evening | ORAL | 1 refills | 60.00000 days | Status: CP
Start: 2020-01-15 — End: ?

## 2020-01-15 MED ORDER — QUETIAPINE 200 MG TABLET
ORAL_TABLET | Freq: Every evening | ORAL | 1 refills | 60.00000 days | Status: CP
Start: 2020-01-15 — End: ?

## 2020-01-15 MED ORDER — ALPRAZOLAM 1 MG TABLET
ORAL_TABLET | Freq: Four times a day (QID) | ORAL | 3 refills | 30.00000 days | Status: CP
Start: 2020-01-15 — End: ?

## 2020-01-21 DIAGNOSIS — Z20822 Encounter for preprocedure screening laboratory testing for COVID-19: Principal | ICD-10-CM

## 2020-01-21 DIAGNOSIS — Z01812 Encounter for preprocedural laboratory examination: Principal | ICD-10-CM

## 2020-01-22 ENCOUNTER — Ambulatory Visit: Admit: 2020-01-22 | Discharge: 2020-01-23 | Payer: MEDICARE

## 2020-01-22 DIAGNOSIS — Z01812 Encounter for preprocedural laboratory examination: Secondary | ICD-10-CM | POA: Diagnosis not present

## 2020-01-22 DIAGNOSIS — Z20822 Contact with and (suspected) exposure to covid-19: Secondary | ICD-10-CM | POA: Diagnosis not present

## 2020-01-25 ENCOUNTER — Encounter: Admit: 2020-01-25 | Discharge: 2020-01-25 | Payer: MEDICARE

## 2020-01-25 ENCOUNTER — Encounter: Admit: 2020-01-25 | Discharge: 2020-01-25 | Payer: MEDICARE | Attending: Anesthesiology | Primary: Anesthesiology

## 2020-01-25 DIAGNOSIS — C210 Malignant neoplasm of anus, unspecified: Principal | ICD-10-CM

## 2020-01-25 DIAGNOSIS — C20 Malignant neoplasm of rectum: Principal | ICD-10-CM

## 2020-01-25 DIAGNOSIS — C44520 Squamous cell carcinoma of anal skin: Secondary | ICD-10-CM | POA: Diagnosis not present

## 2020-01-25 DIAGNOSIS — Z9221 Personal history of antineoplastic chemotherapy: Secondary | ICD-10-CM | POA: Diagnosis not present

## 2020-01-25 DIAGNOSIS — G8929 Other chronic pain: Secondary | ICD-10-CM | POA: Diagnosis not present

## 2020-01-25 DIAGNOSIS — Z79899 Other long term (current) drug therapy: Secondary | ICD-10-CM | POA: Diagnosis not present

## 2020-01-25 DIAGNOSIS — C211 Malignant neoplasm of anal canal: Secondary | ICD-10-CM | POA: Diagnosis not present

## 2020-01-25 DIAGNOSIS — Z923 Personal history of irradiation: Secondary | ICD-10-CM | POA: Diagnosis not present

## 2020-01-25 DIAGNOSIS — I10 Essential (primary) hypertension: Secondary | ICD-10-CM | POA: Diagnosis not present

## 2020-01-25 DIAGNOSIS — E785 Hyperlipidemia, unspecified: Secondary | ICD-10-CM | POA: Diagnosis not present

## 2020-01-25 DIAGNOSIS — Z8572 Personal history of non-Hodgkin lymphomas: Secondary | ICD-10-CM | POA: Diagnosis not present

## 2020-01-25 DIAGNOSIS — Z87891 Personal history of nicotine dependence: Secondary | ICD-10-CM | POA: Diagnosis not present

## 2020-01-25 DIAGNOSIS — K6289 Other specified diseases of anus and rectum: Secondary | ICD-10-CM | POA: Diagnosis not present

## 2020-01-25 DIAGNOSIS — Z888 Allergy status to other drugs, medicaments and biological substances status: Secondary | ICD-10-CM | POA: Diagnosis not present

## 2020-01-25 MED ORDER — ACETAMINOPHEN 500 MG TABLET
ORAL_TABLET | Freq: Three times a day (TID) | ORAL | 0 refills | 5.00000 days
Start: 2020-01-25 — End: ?

## 2020-01-25 MED ORDER — OXYCODONE 5 MG TABLET
ORAL_TABLET | ORAL | 0 refills | 3.00000 days | Status: CP | PRN
Start: 2020-01-25 — End: 2020-01-30

## 2020-01-29 DIAGNOSIS — C210 Malignant neoplasm of anus, unspecified: Principal | ICD-10-CM

## 2020-01-30 DIAGNOSIS — K6289 Other specified diseases of anus and rectum: Principal | ICD-10-CM

## 2020-01-30 DIAGNOSIS — C210 Malignant neoplasm of anus, unspecified: Principal | ICD-10-CM

## 2020-01-30 DIAGNOSIS — C211 Malignant neoplasm of anal canal: Principal | ICD-10-CM

## 2020-02-12 ENCOUNTER — Encounter: Admit: 2020-02-12 | Discharge: 2020-03-12 | Payer: MEDICARE

## 2020-02-12 ENCOUNTER — Encounter
Admit: 2020-02-12 | Discharge: 2020-03-12 | Payer: MEDICARE | Attending: Radiation Oncology | Primary: Radiation Oncology

## 2020-02-12 ENCOUNTER — Ambulatory Visit
Admit: 2020-02-12 | Discharge: 2020-03-12 | Payer: MEDICARE | Attending: Radiation Oncology | Primary: Radiation Oncology

## 2020-02-12 ENCOUNTER — Ambulatory Visit: Admit: 2020-02-12 | Discharge: 2020-02-27 | Payer: MEDICARE

## 2020-02-12 ENCOUNTER — Ambulatory Visit: Admit: 2020-02-14 | Discharge: 2020-02-15 | Payer: MEDICARE

## 2020-02-12 ENCOUNTER — Encounter
Admit: 2020-02-12 | Discharge: 2020-03-12 | Payer: MEDICARE | Attending: Hematology & Oncology | Primary: Hematology & Oncology

## 2020-02-13 DIAGNOSIS — C210 Malignant neoplasm of anus, unspecified: Principal | ICD-10-CM

## 2020-02-14 DIAGNOSIS — C210 Malignant neoplasm of anus, unspecified: Principal | ICD-10-CM

## 2020-02-14 DIAGNOSIS — Z923 Personal history of irradiation: Secondary | ICD-10-CM | POA: Diagnosis not present

## 2020-02-14 DIAGNOSIS — Z87891 Personal history of nicotine dependence: Secondary | ICD-10-CM | POA: Diagnosis not present

## 2020-02-14 DIAGNOSIS — Z8572 Personal history of non-Hodgkin lymphomas: Secondary | ICD-10-CM | POA: Diagnosis not present

## 2020-02-14 DIAGNOSIS — Z510 Encounter for antineoplastic radiation therapy: Secondary | ICD-10-CM | POA: Diagnosis not present

## 2020-02-14 DIAGNOSIS — Z9221 Personal history of antineoplastic chemotherapy: Secondary | ICD-10-CM | POA: Diagnosis not present

## 2020-02-14 DIAGNOSIS — I10 Essential (primary) hypertension: Secondary | ICD-10-CM | POA: Diagnosis not present

## 2020-02-14 DIAGNOSIS — C211 Malignant neoplasm of anal canal: Secondary | ICD-10-CM | POA: Diagnosis not present

## 2020-02-14 DIAGNOSIS — R911 Solitary pulmonary nodule: Secondary | ICD-10-CM | POA: Diagnosis not present

## 2020-02-14 MED ORDER — CAPECITABINE 500 MG TABLET
ORAL_TABLET | Freq: Two times a day (BID) | ORAL | 0 refills | 28 days | Status: CP
Start: 2020-02-14 — End: ?
  Filled 2020-02-21: qty 120, 28d supply, fill #0

## 2020-02-15 DIAGNOSIS — C210 Malignant neoplasm of anus, unspecified: Principal | ICD-10-CM

## 2020-02-18 ENCOUNTER — Encounter: Admit: 2020-02-18 | Discharge: 2020-02-19 | Payer: MEDICARE

## 2020-02-18 ENCOUNTER — Ambulatory Visit: Admit: 2020-02-18 | Discharge: 2020-02-19 | Payer: MEDICARE

## 2020-02-18 DIAGNOSIS — C211 Malignant neoplasm of anal canal: Secondary | ICD-10-CM | POA: Diagnosis not present

## 2020-02-18 DIAGNOSIS — C218 Malignant neoplasm of overlapping sites of rectum, anus and anal canal: Secondary | ICD-10-CM | POA: Diagnosis not present

## 2020-02-18 MED ADMIN — Fluorine F-18 FDG 4-40 mCi IV: 12.08 | INTRAVENOUS | @ 14:00:00 | Stop: 2020-02-18

## 2020-02-18 NOTE — Unmapped (Unsigned)
[facility]   Patient Onboarding/Medication Counseling    Damon Mills is a 59 y.o. male with anal cancer who I am counseling today on initiation of therapy.  I am speaking to the patient.    Was a Nurse, learning disability used for this call? No    Verified patient's date of birth / HIPAA.    Specialty medication(s) to be sent: Hematology/Oncology: Capecitabine 500mg , directions: Take 3 tablets (1,500 mg total) by mouth Two (2) times a day Monday -Friday on radiation days.    Non-specialty medications/supplies to be sent: none    Medications not needed at this time: none     Xeloda (capecitabine)    Medication & Administration     Dosage:Take 3 tablets (1,500 mg total) by mouth Two (2) times a day Monday -Friday on radiation days.    Administration: I reviewed the importance of taking with a full glass of water within 30 minutes of a meal (at least 1 cup of food). Discussed that tablet should be swallowed whole and cannot be crushed or chewed.    Adherence/Missed dose instruction: If a dose is missed, do not take an extra dose or two doses at one time. Simply take your next dose at the regularly scheduled time and record any missed doses so our team is aware.    Goals of Therapy     Prevent disease progression    Side Effects & Monitoring Parameters     ??? Nausea/vomiting  ??? Diarrhea/constipation  ??? Infection precautions  ??? Fatigue  ??? Mouth sores or irritation - 1/2 tea salt + 1/2 baking soda in 8 ounces water  ??? Hand/foot syndrome (tingling, numbness, pain, redness, peeling or blistering) Use Urea 20% cream, Aquaphor, Eucerin, Cetaphil, or CeraVe twice daily on hands and feet as preventative  ??? Sun precautions  ??? Decrease appetite/ taste changes  ??? Bleeding precautions (bruising easily, nose bleeds, gums bleed)  ??? Headache  ??? Dry skin  ??? Hair loss    The following side effects should be reported to the provider:  ??? Diarrhea not controlled by anti-diarrheals  ??? Swelling, warmth, numbness, change of color or pain infection (fever >100.4, chills, sore throat, sputum production)  ??? Signs of liver problems (dark urine, abdominal pain, light-colored stools, vomiting, yellow skin or eyes)  ??? Signs of bleeding (vomiting or coughing up blood, blood that looks like coffee grounds, blood in the urine or black, red tarry stools, bruising that gets bigger without reason, any persistent or severe bleeding)  ??? Skin rash or signs of skin infection (cracking, peeling, blistering, bleeding skin, red or irritated eyes)  ??? Signs of fluid and electrolyte problems (mood changes, confusion, abnormal/fast  heartbeat, severe dizziness, passing out, increased thirst, seizures, loss of strength and energy, lack of appetite, unable to pass urine or change in amount of urine passed, dry mouth, dry eyes, or nausea or vomiting.  ??? Signs of hand foot syndrome (redness or irritation on the palms of the hands or soles of the feet)  ??? Signs of mucositis (red or swollen mouth/gums, sores in the mouth, gums or tongue, soreness or pain in the mouth or throat, difficulty swallowing or talking, dryness, mild burning, or pain when eating food white patches or pus in the mouth or on the tongue, increased mucus or thicker saliva)  ??? Signs of anaphylaxis (wheezing, chest tightness, swelling of face, lips, tongue or throat)    Monitoring parameters: Renal function should be estimated at baseline to determine initial  dose. During therapy, CBC with differential, hepatic function, and renal function should be monitored. Monitor INR closely if receiving concomitant warfarin. Pregnancy test prior to treatment initiation (in females of reproductive potential). Monitor for diarrhea, dehydration, hand-foot syndrome, Stevens-Johnson syndrome, toxic epidermal necrolysis, stomatitis, and cardiotoxicity. Monitor adherence.    Contraindications, Warnings & Precautions     [place] Boxed Warning: Capecitabine may increase the anticoagulant effects of warfarin; bleeding events, including warfarin; bleeding events, including death, have occurred with concomitant use. Clinically significant increases in prothrombin time (PT) and INR have occurred within several days to months after capecitabine initiation (in patients previously stabilized on anticoagulants), and may continue up to 1 month after capecitabine discontinuation. May occur in patients with or without liver metastases. Monitor PT and INR frequently and adjust anticoagulation dosing accordingly. An increased risk of coagulopathy is correlated with a cancer diagnosis and age >60 years.    Contraindications:  ??? Known hypersensitivity to capecitabine, fluorouracil, or any component of the formulation;   ??? Severe renal impairment (CrCl <30 mL/minute)    Warnings and Precautions:  ??? Bone marrow suppression  ??? Cardiotoxicity: Myocardial infarction, ischemia, angina, dysrhythmias, cardiac arrest, cardiac failure, sudden death, ECG changes, and cardiomyopathy  ??? Mouth sores-discussed use of baking soda/salt water rinses  ??? Dermatologic toxicity: Stevens-Johnson syndrome and toxic epidermal necrolysis   ??? Hand/foot prevention (moisturizers, luke warm hand washes, patting hands/feet dry, decrease rubbing on hands/feet)  ??? GI toxicity: May cause diarrhea (may be severe); Importance of good nutrition to help minimize diarrhea (high protein, BRAT, yogurt, avoid greasy and spicy foods).  Importance of hydration if having frequent diarrhea  ??? Reproductive concerns: Evaluate pregnancy status prior to therapy in females of reproductive potential. Females of reproductive potential should use effective contraception during treatment and for 6 months after the last dose. Males with male partners of reproductive potential should use effective contraception during treatment and for 3 months after the last dose. Based on the mechanism of action and data from animal reproduction studies, in utero exposure to capecitabine may cause fetal harm.   ??? Breast feeding capecitabine is present in breast milk. Due to the potential for serious adverse reactions in the breastfed infant, breastfeeding is not recommended by the manufacturer during treatment and for 2 weeks after the last dose.    Drug/Food Interactions     ??? Medication list reviewed in Epic. The patient was instructed to inform the care team before taking any new medications or supplements. No drug interactions identified.   ??? Avoid live vaccines.    Storage, Handling Precautions, & Disposal     ??? This medication should be stored at room temperature and in a dry location. Keep out of reach of others including children and pets. Keep the medicine in the original container with a child-proof top (no pillboxes). Do not throw away or flush unused medication down the toilet or sink. This drug is considered hazardous and should be handled as little as possible.  If someone else helps with medication administration, they should wear gloves.    Current Medications (including OTC/herbals), Comorbidities and Allergies     Current Outpatient Medications   Medication Sig Dispense Refill   ??? acetaminophen (TYLENOL) 500 MG tablet Take 2 tablets (1,000 mg total) by mouth every eight (8) hours. 30 tablet 0   ??? albuterol (PROVENTIL HFA) 90 mcg/actuation inhaler Inhale 2 puffs every four (4) hours as needed. Inhale 2 inhalations into the lungs every 4 (four) hours as needed.     ???  ALPRAZolam (XANAX) 1 MG tablet Take 1 tablet (1 mg total) by mouth Four (4) times a day. 120 tablet 3   ??? BIKTARVY 50-200-25 mg tablet Take 1 tablet by mouth daily.     ??? capecitabine (XELODA) 500 MG tablet Take 3 tablets (1,500 mg total) by mouth Two (2) times a day Monday -Friday on radiation days. 168 tablet 0   ??? ketoconazole (NIZORAL) 2 % cream Apply 1 application topically two (2) times a day as needed. Apply topically 2 (two) times daily. (Patient not taking: Reported on 02/14/2020)     ??? levocetirizine (XYZAL) 5 MG tablet Take 5 mg by mouth once daily. ??? meclizine (ANTIVERT) 25 mg tablet Take 25 mg by mouth. Frequency:Q6HPRN   Dosage:25   MG  Instructions:  Note:Dose: 25 MG     ??? mirtazapine (REMERON) 30 MG tablet Take 2 tablets (60 mg total) by mouth nightly. 120 tablet 1   ??? mometasone (NASONEX) 50 mcg/actuation nasal spray Frequency:QD   Dosage:50   MCG  Instructions:  Note:Dose: 50 MCG     ??? polyethylene glycol (MIRALAX) 17 gram/dose powder Take 17 g by mouth daily as needed.     ??? promethazine (PHENERGAN) 25 MG tablet Take 25 mg by mouth. Frequency:Q6HPRN   Dosage:25   MG  Instructions:  Note:Dose: 25 MG     ??? QUEtiapine (SEROQUEL) 200 MG tablet Take 1 tablet (200 mg total) by mouth nightly. 60 tablet 1     No current facility-administered medications for this visit.       Allergies   Allergen Reactions   ??? Hivid [Zalcitabine] Anaphylaxis, Rash and Nausea And Vomiting     Patient unsure of this med.  Patient unsure of this med.   ??? Ziagen [Abacavir] Anaphylaxis, Rash and Nausea And Vomiting   ??? Cymbalta [Duloxetine] Other (See Comments) and Nausea And Vomiting     Possible seizure like event   ??? Duloxetine Hcl Nausea And Vomiting     Possible seizure like event       Patient Active Problem List   Diagnosis   ??? Major depressive disorder, recurrent (CMS-HCC)   ??? Anxiety disorder   ??? Abnormal loss of weight   ??? Malignant lymphoma of extranodal and solid organ sites (CMS-HCC)   ??? Human immunodeficiency virus (HIV) disease (CMS-HCC)   ??? Mixed hypercholesterolemia and hypertriglyceridemia   ??? Benign essential hypertension   ??? Rectal mass   ??? Anal cancer (CMS-HCC)     333  Reviewed and up to date in Epic.    Appropriateness of Therapy     Is medication and dose appropriate based on diagnosis? Yes    Prescription has been clinically reviewed: Yes    Baseline Quality of Life Assessment      How many days over the past month did your anal cancer  keep you from your normal activities? For example, brushing your teeth or getting up in the morning. {Blank:[id number]::0,***,Patient declined to answer}    Financial Information     Medication Assistance provided: None Required    Anticipated copay of $0 / 28 days reviewed with patient. Verified delivery address.    Delivery Information     Scheduled delivery date: ***    Expected start date: ***    Medication will be delivered via UPS to the prescription address in Epic [place].  This shipment will not require a signature.      Explained the services we provide at [facility]  and that each month we would call to set up refills.  Stressed importance of returning phone calls so that we could ensure they receive their medications in time each month.  Informed patient that we should be setting up refills 7-10 days prior to when they will run out of medication.  A pharmacist will reach out to perform a clinical assessment periodically.  Informed patient that a welcome packet and a drug information handout will be sent.      Patient verbalized understanding of the above information as well as how to contact the pharmacy at [phone] option 4 with any questions/concerns.  The pharmacy is open Monday through Friday 8:30am-4:30pm.  A pharmacist is available 24/7 via pager to answer any clinical questions they may have.    Patient Specific Needs     - Does the patient have any physical, cognitive, or cultural barriers? No    - Patient prefers to have medications discussed with  Patient     - Is the patient or caregiver able to read and understand education materials at a high school level or above? Yes    - Patient's primary language is  English     - Is the patient high risk? Yes, patient is taking oral chemotherapy. Appropriateness of therapy has been assessed.     - Does the patient require a Care Management Plan? No     - Does the patient require physician intervention or other additional services (i.e. nutrition, smoking cessation, social work)? No      Shamell Suarez A Sh[facility] Specialty Pharmacist

## 2020-02-18 NOTE — Unmapped (Signed)
[facility] Specialty Medication Onboarding    Specialty Medication: Capecitabine 500mg  tablets  Prior Authorization: Not Required   Financial Assistance: No - copay  <$25  Final Copay/Day Supply: $0 / 28 days    Insurance Restrictions: None     Notes to Pharmacist: Patient has Medicaid as secondary.    The triage team has completed the benefits investigation and has determined that the patient is able to fill this medication at [facility]. Please contact the patient to complete the onboarding or follow up with the prescribing physician as needed.

## 2020-02-19 NOTE — Unmapped (Signed)
[facility] GI MEDICAL ONCOLOGY     REFERRING PROVIDER:   Sharyn Lull, Md  [address]  [place],  [place] [address]    PRIMARY CARE PROVIDER:  DAVID Ezra Sites, MD [ID]  [address] [facility] ??? INTERNAL MEDICINE  [place] [address]    CONSULTING PROVIDERS  Dr. Rayetta Humphrey, [facility] Radiation Oncology  ____________________________________________________________________    CANCER HISTORY  Diagnosis stage II SCC of anus  1. 02/2020 mod diff squamous cell carcinoma, 3.6 cm.     ____________________________________________________________________    ASSESSMENT  1. cT2N0M0 anus/anal canal SCC  2. Indeterminate pulmonary nodule, likely benign given prior history. PET pending  3. Well controlled HIV  4. MDD/GAD, managed but anxiety in particular remains active  5. History of lymphoma and prior CHOP  6. ECOG PS 0      RECOMMENDATIONS  1. Curative intent chemoradiotherapy with 5FU (capecitabine) + mitomycin    DISCUSSION    We have reviewed imaging and path showing what at present looks to be a stageIIA anal canal SCC, and our goal of curative treatment. We've discussed the treatment paradigm for anal cancers, with the standard curative approach being chemoradiotherapy with a goal of both organ preservation (e.g. Colostomy free survival) and cure. Treatment includes radiotherapy for ~5.5-6 weeks with concurrent chemotherapy given on weeks 1 and 5 of the course. Chemotherapy consists of 5FU for 96 hr infusion and mitomycin. Multiple lines of evidence suggest that the addition of chemotherapy improves outcomes over RT alone, and that the combination of 5FU mitomycin is substantially more effective than 5FU only. Though there is not level 1 evidence, multiple phase II trials support the use of capecitabine (825-850 mg/m2 BID on RT days) in place of 5FU (eg Glynne-Jones R, et al, Int J Radiat Oncol Biol Phys. 2008;72(1):119). This is also supported by NCCN guidelines. As this obviates the need for infusion pump and central access, I recommend capecitabine    We have also reviewed that there is ample data showing that patients with well-controlled HIV should be treated with full doses of chemotherapy without significant risk of increase adverse effects and have similar oncologic outcomes.  We have discussed the adverse effects of the chemoradiotherapy and the chemotherapy in particular.  Notable chemotherapy associated effects include cytopenias with the risk for febrile neutropenia as well as neutropenia with infection in the setting of radiotherapy to the anus and anal canal.  We also discussed the potential for severe complications with this chemotherapy regimen this is generally fairly low.  He did have considerable nausea with his job.  Reviewed that this tends to be much less common with mitomycin and capecitabine though can be present and we will premedicate him appropriately for his mitomycin dosing.    Finally we discussed the plan for posttreatment follow-up.  Time to complete response is typically months following completion of radiotherapy.  Therefore as long as he is having ongoing improvement in his primary cancer we will hold off on considering surgical intervention.  If we see that either there is plateauing residual disease or progressive disease then we would refer him back to Dr. Milbert Coulter for surgical resection.     He has appropriate anxiety related to his current diagnosis but reports that he has 2 options with his psychiatrist and will continue to see them regularly to help [place] during his treatment.  ______________________________________________________________________  HISTORY     Damon Mills is seen today at the [facility] GI Medical Oncology  Clinic for consultation regarding squamous cell carcinoma of anus/anal canal at the request of Dr. Milbert Coulter.    Damon Mills feels fine-- had what he thought was a hemorrhoid for many months, but when it didn't resolve sought medical attention. He otherwise has no issues, remains active, not limited in his function by health    Backside is a little sore, but not bad.   Gained a lot of weight during COVID (30pounds).    MEDICAL HISTORY  1. HIV, diagnosed 3. 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 Major depressive disorder; generalized anxiety disorder   3. Lymphoma, RCHOP + involved field RT, 2006    Allergies   Allergen Reactions   ??? Hivid [Zalcitabine] Anaphylaxis, Rash and Nausea And Vomiting     Patient unsure of this med.  Patient unsure of this med.   ??? Ziagen [Abacavir] Anaphylaxis, Rash and Nausea And Vomiting   ??? Cymbalta [Duloxetine] Other (See Comments) and Nausea And Vomiting     Possible seizure like event   ??? Duloxetine Hcl Nausea And Vomiting     Possible seizure like event     Medications reviewed in the EMR    FAMILY HISTORY  No cancer    SOCIAL HISTORY  LOng-term partner, [name]. He does not work outside the home, very active in his garden, moving yard, maintaining home. Used to be very active in Education officer, environmental, did a lot of teaching to youth. Stopped with his lymphoma dx. Stopped smoking in 2007 as well. Rare EtOH. No drugs.     REVIEW OF SYSTEMS  The remainder of a comprehensive 10 systems review is negative.    PHYSICAL EXAM  There were no vitals taken for this visit.   GENERAL: well developed, well nourished, in no distress  PSYCH: full and appropriate range of affect with good insight and judgement  HEENT: NCAT, pupils equal, sclerae anicteric, OP clear without erythema, exudate or ulceration; neck supple without thyromegaly or masses;   LYMPH: no cervical, supraclavicular, periumbilical adenopathy  PULM: normal resp rate, clear bilaterally without wheezes, rhonchi, rales  COR: regular without murmur, rub, gallop  GI: abdomen soft, nontender, nondistended, no palpable hepatosplenomegaly   EXT: warm and well perfused, no edema  SKIN: No rashes    OBJECTIVE DATA  LABS  10/2019 normal organ function     PATHOLOGY:  reviewed    RADIOLOGY RESULTS (scans are personally reviewed by me and my impression)  CT chest:   - 1.3 cm spiculated nodule in LUL, new since 2006. Possible primary lung cancer  CT abd/pelvis  - 3.6 cm anal mass, no evidence for nodes

## 2020-02-21 MED FILL — CAPECITABINE 500 MG TABLET: 28 days supply | Qty: 120 | Fill #0 | Status: AC

## 2020-02-22 DIAGNOSIS — C210 Malignant neoplasm of anus, unspecified: Secondary | ICD-10-CM | POA: Diagnosis not present

## 2020-02-22 DIAGNOSIS — T7840XA Allergy, unspecified, initial encounter: Secondary | ICD-10-CM | POA: Diagnosis not present

## 2020-02-22 DIAGNOSIS — Z0000 Encounter for general adult medical examination without abnormal findings: Secondary | ICD-10-CM | POA: Diagnosis not present

## 2020-02-22 DIAGNOSIS — C8590 Non-Hodgkin lymphoma, unspecified, unspecified site: Secondary | ICD-10-CM | POA: Diagnosis not present

## 2020-02-25 NOTE — Unmapped (Signed)
Patient [facility] Cancer Support Call  Distress Level: 6  Barrier assessment: financial, communication, practical, psychosocial and medical system.  Notes:  Covid vaccine received in april. Discussed [facility] resources, as patient has been struggling with anxiety recently. Mentioned [facility] psychooncology services, but patient isnt interested as he is already connected with  psychiatry. Also talked about resources offered by the [facility]; he was interested and may visit in the future. Drive to the hospital takes him about 45 minutes, but his partner is able to come with him and he hasn't had any issues with transportation. No issues regarding billing, food insecurity, receiving medications, or side effects yet (patient has not started treatment). Patient knows what adnace directives are and has already filled them out, but has had trouble locating them. He is interested in having the A.D. packet sent out to his home so he can have them to put on file with [facility]. Patient is comfortable using mychart and has not experienced any difficulties using the app. I discussed the colorectal alliance buddy program and he was interested, so i provided the website to where he can sign up.  Referrals: [facility] staff to mail advance directive packet.External referral for Colorectal Alliance Buddy Program  Call made by:     Volunteer Patient Navigator  Length of call:

## 2020-02-26 DIAGNOSIS — C210 Malignant neoplasm of anus, unspecified: Principal | ICD-10-CM

## 2020-02-26 DIAGNOSIS — C211 Malignant neoplasm of anal canal: Secondary | ICD-10-CM | POA: Diagnosis not present

## 2020-02-26 NOTE — Unmapped (Signed)
RADIATION ONCOLOGY TREATMENT PLANNING NOTE     Patient Name: Damon Mills  Patient Age: 59 y.o.  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 Refer to the consult for full clinical details.    I plan to treat him/her with photons utilizing IMRT  technique.     The radiation target area/treatment site will be the anal cancer and pelvic/inguinal LN    I will attempt to minimize the dose to small bowel, rectum, bladder, femoral heads    The total radiation dose will be 5400 cGy at 180 cGy/fraction for a total of 30 fractions, treated once a day. Chemotherapy administered concurrently.    Treatment Intent: curative.    Technique Rationale:   IMRT:  IMRT is anticipated to be necessary to optimize the dose homogeneity to a complex tumor/target volume and minimize the dose to the normal tissue structures listed above    Simulation Order: I requested radiation treatment planning CT scan to acquire information regarding patient's anatomy of the treatment site, radiation treatment target volumes, and adjacent normal organs. This is required to be done in patient's treatment position for radiation treatment planning and for patient's subsequent radiation treatment positioning and treatment setups.     Image Fusion:  I have ordered that the following additional imaging studies be fused to the CT simulation scan to aid in tumor mapping and treatment planning PET/CT.    Special Treatment Procedure: Treatment planning for Damon Mills is more complex and requires more time because of concurrent chemotherapy necessitating more monitoring/management of acute toxicities.    Verification Simulation: I have ordered for the patient to come in for verification simulation on the treatment machine to ensure that information was transferred appropriately from the planning system to the treatment machine treatment and to verify patient setup, immobilization, and image guidance.    Image Guidance/Tracking: Daily CBCT/MVCT to assess the position accuracy of the target volume and critical structures.     SIMULATION:    Type:  Initial simulation of the Pelvis    Contrast: None    The patient was taken to the CT simulation room and placed in a supine position with customized immobilization and/or position devices including wingboard.  Catheters/markers were placed on surface anatomy of interest including the perianal extension of tumor.   A CT images were obtained from the mid-abdomen to mid-thigh.  I approved the patient set up and reviewed the CT images; both are adequate. I tentatively plan to utilize multiple fields. The number and use of treatment devices will be determined at the time of computerized planning.    I have placed an isocenter/localization point in three dimensions on these images.  This was marked on the patient???s skin for subsequent radiation treatment set-up.  Additional details of the CT simulation are available in the departmental [facility] electronic medical record.  CT images were then transferred to the radiation treatment-planning computer for planning and dosimetry.    Rayetta Humphrey, MD  Assistant Professor  [facility] Dept of Radiation Oncology  02/26/2020

## 2020-03-07 DIAGNOSIS — Z923 Personal history of irradiation: Secondary | ICD-10-CM | POA: Diagnosis not present

## 2020-03-07 DIAGNOSIS — C211 Malignant neoplasm of anal canal: Secondary | ICD-10-CM | POA: Diagnosis not present

## 2020-03-07 DIAGNOSIS — Z8572 Personal history of non-Hodgkin lymphomas: Secondary | ICD-10-CM | POA: Diagnosis not present

## 2020-03-07 DIAGNOSIS — Z9221 Personal history of antineoplastic chemotherapy: Secondary | ICD-10-CM | POA: Diagnosis not present

## 2020-03-07 DIAGNOSIS — Z510 Encounter for antineoplastic radiation therapy: Secondary | ICD-10-CM | POA: Diagnosis not present

## 2020-03-07 DIAGNOSIS — I10 Essential (primary) hypertension: Secondary | ICD-10-CM | POA: Diagnosis not present

## 2020-03-07 DIAGNOSIS — Z87891 Personal history of nicotine dependence: Secondary | ICD-10-CM | POA: Diagnosis not present

## 2020-03-07 DIAGNOSIS — R911 Solitary pulmonary nodule: Secondary | ICD-10-CM | POA: Diagnosis not present

## 2020-03-11 DIAGNOSIS — Z9221 Personal history of antineoplastic chemotherapy: Secondary | ICD-10-CM | POA: Diagnosis not present

## 2020-03-11 DIAGNOSIS — Z923 Personal history of irradiation: Secondary | ICD-10-CM | POA: Diagnosis not present

## 2020-03-11 DIAGNOSIS — Z87891 Personal history of nicotine dependence: Secondary | ICD-10-CM | POA: Diagnosis not present

## 2020-03-11 DIAGNOSIS — Z8572 Personal history of non-Hodgkin lymphomas: Secondary | ICD-10-CM | POA: Diagnosis not present

## 2020-03-11 DIAGNOSIS — C211 Malignant neoplasm of anal canal: Secondary | ICD-10-CM | POA: Diagnosis not present

## 2020-03-11 DIAGNOSIS — R911 Solitary pulmonary nodule: Secondary | ICD-10-CM | POA: Diagnosis not present

## 2020-03-11 DIAGNOSIS — I10 Essential (primary) hypertension: Secondary | ICD-10-CM | POA: Diagnosis not present

## 2020-03-11 DIAGNOSIS — Z510 Encounter for antineoplastic radiation therapy: Secondary | ICD-10-CM | POA: Diagnosis not present

## 2020-03-12 NOTE — Unmapped (Signed)
03/12/20 confirmed 7/15 and 7/6 appointments with [facility] @ 4:26.

## 2020-03-12 NOTE — Unmapped (Signed)
[facility] Clinical Assessment & Refill Coordination Note    Damon Mills, Damon Mills: September 14, 1960  Phone: [phone] (home)     All above HIPAA information was verified with patient.     Was a Nurse, learning disability used for this call? No    Specialty Medication(s):   Hematology/Oncology: Capecitabine 500mg , directions: Take 3 tablets (1,500 mg total) by mouth Two (2) times a day Monday -Friday on radiation days.     Current Outpatient Medications   Medication Sig Dispense Refill   ??? acetaminophen (TYLENOL) 500 MG tablet Take 2 tablets (1,000 mg total) by mouth every eight (8) hours. 30 tablet 0   ??? albuterol (PROVENTIL HFA) 90 mcg/actuation inhaler Inhale 2 puffs every four (4) hours as needed. Inhale 2 inhalations into the lungs every 4 (four) hours as needed.     ??? ALPRAZolam (XANAX) 1 MG tablet Take 1 tablet (1 mg total) by mouth Four (4) times a day. 120 tablet 3   ??? BIKTARVY 50-200-25 mg tablet Take 1 tablet by mouth daily.     ??? capecitabine (XELODA) 500 MG tablet Take 3 tablets (1,500 mg total) by mouth Two (2) times a day Monday -Friday on radiation days. 168 tablet 0   ??? levocetirizine (XYZAL) 5 MG tablet Take 5 mg by mouth once daily.     ??? meclizine (ANTIVERT) 25 mg tablet Take 25 mg by mouth. Frequency:Q6HPRN   Dosage:25   MG  Instructions:  Note:Dose: 25 MG     ??? mirtazapine (REMERON) 30 MG tablet Take 2 tablets (60 mg total) by mouth nightly. 120 tablet 1   ??? mometasone (NASONEX) 50 mcg/actuation nasal spray Frequency:QD   Dosage:50   MCG  Instructions:  Note:Dose: 50 MCG     ??? polyethylene glycol (MIRALAX) 17 gram/dose powder Take 17 g by mouth daily as needed.     ??? promethazine (PHENERGAN) 25 MG tablet Take 25 mg by mouth. Frequency:Q6HPRN   Dosage:25   MG  Instructions:  Note:Dose: 25 MG     ??? QUEtiapine (SEROQUEL) 200 MG tablet Take 1 tablet (200 mg total) by mouth nightly. 60 tablet 1     No current facility-administered medications for this visit.        Changes to medications: Damon Mills reports no changes at this time.    Allergies   Allergen Reactions   ??? Hivid [Zalcitabine] Anaphylaxis, Rash and Nausea And Vomiting     Patient unsure of this med.  Patient unsure of this med.   ??? Ziagen [Abacavir] Anaphylaxis, Rash and Nausea And Vomiting   ??? Cymbalta [Duloxetine] Other (See Comments) and Nausea And Vomiting     Possible seizure like event   ??? Duloxetine Hcl Nausea And Vomiting     Possible seizure like event       Changes to allergies: No    SPECIALTY MEDICATION ADHERENCE     capecitabine 500 mg: 28 days of medicine on hand - has not started radiation.  First day scheduled for 03/18/20    Medication Adherence    Patient reported X missed doses in the last month: 0  Specialty Medication: capecitabine 500 mg - Take 3 tablets (1,500 mg total) by mouth Two (2) times a day Monday -Friday on radiation days.  Patient is on additional specialty medications: No  Informant: patient  Confirmed plan for next specialty medication refill: delivery by pharmacy          Specialty medication(s) dose(s) confirmed: Regimen is correct and  unchanged.     Are there any concerns with adherence? NA - TX not started yet    Adherence counseling provided? Not needed    CLINICAL MANAGEMENT AND INTERVENTION      Clinical Benefit Assessment:    Do you feel the medicine is effective or helping your condition? NA - TX starts 03/18/20    Clinical Benefit counseling provided? Not needed    Adverse Effects Assessment:    Are you experiencing any side effects? NA - TX starts 07/06    Are you experiencing difficulty administering your medicine? NA    Quality of Life Assessment:    How many days over the past month did your Anal cancer  keep you from your normal activities? For example, brushing your teeth or getting up in the morning. 0    Have you discussed this with your provider? Not needed    Therapy Appropriateness:    Is therapy appropriate? Yes, therapy is appropriate and should be continued DISEASE/MEDICATION-SPECIFIC INFORMATION      N/A    PATIENT SPECIFIC NEEDS     - Does the patient have any physical, cognitive, or cultural barriers? No    - Is the patient high risk? Yes, patient is taking oral chemotherapy. Appropriateness of therapy as been assessed.     - Does the patient require a Care Management Plan? No     - Does the patient require physician intervention or other additional services (i.e. nutrition, smoking cessation, social work)? No      SHIPPING     Specialty Medication(s) to be Shipped:   Hematology/Oncology: Capecitabine 500mg , directions: Take 3 tablets (1,500 mg total) by mouth Two (2) times a day Monday -Friday on radiation days.    Other medication(s) to be shipped: none     Changes to insurance: No    Delivery Scheduled: Yes, Expected medication delivery date: 03/20/20.     Medication will be delivered via UPS to the confirmed prescription address in [facility].    The patient will receive a drug information handout for each medication shipped and additional FDA Medication Guides as required.  Verified that patient has previously received a Conservation officer, historic buildings.    All of the patient's questions and concerns have been addressed.    Damon Mills Shared [facility] Specialty Pharmacist

## 2020-03-13 ENCOUNTER — Encounter
Admit: 2020-03-13 | Discharge: 2020-04-12 | Payer: MEDICARE | Attending: Radiation Oncology | Primary: Radiation Oncology

## 2020-03-13 ENCOUNTER — Ambulatory Visit: Admit: 2020-03-13 | Discharge: 2020-04-12 | Payer: MEDICARE

## 2020-03-13 NOTE — Unmapped (Signed)
Pharmacist Capecitabine Education     Mr.Thurnell Lose is a 59 y.o. male with anal cancer who will be undergoing curative intent chemoradiation with capecitabine and mitomycin. He was counseled on the use of capecitabine + mitomycin.    Dose and schedule discussed:  1500 mg (3 tablets) PO BID on radiation days (Mon-Fri)  Take with water within 30 minutes after a meal at the same time for each scheduled dose.    Missed dose information discussed: If a dose is missed, do not take an extra dose or two doses at one time. Simply take your next dose at the regularly scheduled time and record any missed doses so our team is aware.    Side effects and warnings/precautions discussed (including but not limited to):     ?? Complications of myelosuppression  ?? Diarrhea  ?? Mucositis  ?? Hand-foot syndrome  ?? Liver dysfunction  ?? Nausea  ?? Fatigue  ?? Alopecia  ?? Fluid retention or swelling  ?? Reproductive concerns    A corresponding management plan for these adverse effects was also shared with the patient. Instructions on when to contact our care team were discussed with the patient.    Drug interactions discussed: Medication list reviewed in Epic. The patient was instructed to inform the care team before taking any new medications or supplements.     Storage, handling, and disposal information discussed: Oral chemotherapy handling precautions reviewed. Store at room temperature, in a dry location, away from light. Keep out of reach of others including children and pets. Keep stored in originally provided packaging until the medication is ready to be taken. Do not flush unused medication down the toilet or sink or throw away in household trash; throw away safely by returning unused medication to [facility] or to a local take-back program for proper disposal.    Education handout provided from: [facility] patient education database    Adherence discussed: Stressed the importance of taking medication as prescribed and to contact provider if that changes at any time.    All of the patient's questions were answered and the patient confirmed understanding of how to take their medication.    Time spent: 10 minutes    Ronnald Collum, PharmD  Hematology/Oncology Clinical Pharmacist  Pager [phone]

## 2020-03-18 ENCOUNTER — Ambulatory Visit: Admit: 2020-03-18 | Discharge: 2020-03-19 | Payer: MEDICARE

## 2020-03-18 DIAGNOSIS — C210 Malignant neoplasm of anus, unspecified: Principal | ICD-10-CM

## 2020-03-18 DIAGNOSIS — C211 Malignant neoplasm of anal canal: Secondary | ICD-10-CM | POA: Diagnosis not present

## 2020-03-18 LAB — SODIUM: Sodium:SCnc:Pt:Ser/Plas:Qn:: 138

## 2020-03-18 LAB — HEMOGLOBIN: Hemoglobin:MCnc:Pt:Bld:Qn:: 14.6

## 2020-03-18 LAB — CBC W/ AUTO DIFF
BASOPHILS ABSOLUTE COUNT: 0 10*9/L (ref 0.0–0.1)
BASOPHILS RELATIVE PERCENT: 0.6 %
EOSINOPHILS ABSOLUTE COUNT: 0 10*9/L (ref 0.0–0.7)
EOSINOPHILS RELATIVE PERCENT: 0.7 %
HEMOGLOBIN: 14.6 g/dL (ref 13.5–17.5)
LYMPHOCYTES ABSOLUTE COUNT: 1.2 10*9/L (ref 0.7–4.0)
LYMPHOCYTES RELATIVE PERCENT: 18.4 %
MEAN CORPUSCULAR HEMOGLOBIN CONC: 34.8 g/dL (ref 30.0–36.0)
MEAN CORPUSCULAR VOLUME: 92.1 fL (ref 81.0–95.0)
MEAN PLATELET VOLUME: 8.3 fL (ref 7.0–10.0)
MONOCYTES ABSOLUTE COUNT: 0.4 10*9/L (ref 0.1–1.0)
MONOCYTES RELATIVE PERCENT: 6.2 %
NEUTROPHILS ABSOLUTE COUNT: 4.9 10*9/L (ref 1.7–7.7)
NEUTROPHILS RELATIVE PERCENT: 74.1 %
NUCLEATED RED BLOOD CELLS: 0 /100{WBCs} (ref <=4)
RED BLOOD CELL COUNT: 4.56 10*12/L (ref 4.32–5.72)
RED CELL DISTRIBUTION WIDTH: 13.7 % (ref 12.0–15.0)
WBC ADJUSTED: 6.7 10*9/L (ref 3.5–10.5)

## 2020-03-18 LAB — COMPREHENSIVE METABOLIC PANEL
ALBUMIN: 4.1 g/dL (ref 3.4–5.0)
ALKALINE PHOSPHATASE: 82 U/L (ref 46–116)
ALT (SGPT): 38 U/L (ref 10–49)
AST (SGOT): 28 U/L (ref <34)
BILIRUBIN TOTAL: 0.3 mg/dL (ref 0.3–1.2)
BLOOD UREA NITROGEN: 18 mg/dL (ref 9–23)
BUN / CREAT RATIO: 15
CALCIUM: 9.7 mg/dL (ref 8.7–10.4)
CHLORIDE: 106 mmol/L (ref 98–107)
CO2: 25.6 mmol/L (ref 20.0–31.0)
CREATININE: 1.18 mg/dL — ABNORMAL HIGH (ref 0.60–1.10)
EGFR CKD-EPI AA MALE: 78 mL/min/{1.73_m2}
EGFR CKD-EPI NON-AA MALE: 68 mL/min/{1.73_m2}
GLUCOSE RANDOM: 116 mg/dL (ref 70–179)
POTASSIUM: 3.8 mmol/L (ref 3.5–5.1)
PROTEIN TOTAL: 7 g/dL (ref 5.7–8.2)
SODIUM: 138 mmol/L (ref 135–145)

## 2020-03-18 MED ORDER — PROCHLORPERAZINE MALEATE 10 MG TABLET
ORAL_TABLET | Freq: Four times a day (QID) | ORAL | prn refills | 15 days | Status: CP | PRN
Start: 2020-03-18 — End: ?

## 2020-03-18 MED ORDER — LOPERAMIDE 2 MG CAPSULE
ORAL_CAPSULE | prn refills | 0 days | Status: CP
Start: 2020-03-18 — End: ?

## 2020-03-18 MED ADMIN — ondansetron (ZOFRAN) tablet 8 mg: 8 mg | ORAL | @ 19:00:00 | Stop: 2020-03-18

## 2020-03-18 MED ADMIN — sodium chloride (NS) 0.9 % infusion: 100 mL/h | INTRAVENOUS | @ 20:00:00 | Stop: 2020-03-18

## 2020-03-18 MED ADMIN — mitomycin (MUTAMYCIN) syringe: 10 mg/m2 | INTRAVENOUS | @ 20:00:00 | Stop: 2020-03-18

## 2020-03-18 MED ADMIN — dexAMETHasone (DECADRON) tablet 8 mg: 8 mg | ORAL | @ 19:00:00 | Stop: 2020-03-18

## 2020-03-18 NOTE — Unmapped (Signed)
Patient arrived to infusion for chemotherapy, in stable condition. PIV placed and labs collected. Labs reviewed and found to be in parameters for treatment. Pre meds given and chemo released to pharmacy. Mitomycin given as ordered. Patient tolerated well. No adverse reactions noted. PIV flushed and discontinued per protocol. Patient discharged home, in stable condition, to self care. AVS declined.

## 2020-03-18 NOTE — Unmapped (Signed)
Pharmacy: First Cycle Chemotherapy Patient Education    Chemotherapy regimen/agents: mitomycin/capecitabine  Day 1 of chemotherapy: 03/18/20    I counseling Pricilla Riffle about his new chemotherapy regimen.  Counseling points are below.  We talked about the chemotherapy schedule with radiation.  The patient has had chemotherapy before for lymphoma.     Adverse Effects:      Handout(s) provided from:  [employer].    Adverse effects discussed included but were not limited to the following:      Neutropenia/Febrile Neutropenia  Thrombocytopenia  Anemia  Changes in color of body fluids  Dysgeusia  Nausea/Vomiting  Renal Toxicity  Constipation   Diarrhea  Mucositis  Nail and Skin Changes  Palmar/Plantar Erythrodysthesia (Hand/foot syndrome and skin reaction)  Rare, but serious side effects requiring immediate treatment and physician notification (e.g. fever, bleeding, clots).  HUS, pulmonary toxicity    Patient has received his capecitabine and started this morning.  We discussed management of mucositis and diarrhea (with loperamide) should those occur.      Approximate time spent with patient: 15 mins.  Greater than 80% of my time was spent in direct face-to-face counseling    Thank you, please feel free to page with any questions or concerns.  Gardner Candle, PharmD, BCOP, CPP

## 2020-03-19 DIAGNOSIS — C210 Malignant neoplasm of anus, unspecified: Principal | ICD-10-CM

## 2020-03-19 DIAGNOSIS — Z87891 Personal history of nicotine dependence: Secondary | ICD-10-CM | POA: Diagnosis not present

## 2020-03-19 DIAGNOSIS — R911 Solitary pulmonary nodule: Secondary | ICD-10-CM | POA: Diagnosis not present

## 2020-03-19 DIAGNOSIS — Z510 Encounter for antineoplastic radiation therapy: Secondary | ICD-10-CM | POA: Diagnosis not present

## 2020-03-19 DIAGNOSIS — Z923 Personal history of irradiation: Secondary | ICD-10-CM | POA: Diagnosis not present

## 2020-03-19 DIAGNOSIS — I10 Essential (primary) hypertension: Secondary | ICD-10-CM | POA: Diagnosis not present

## 2020-03-19 DIAGNOSIS — Z9221 Personal history of antineoplastic chemotherapy: Secondary | ICD-10-CM | POA: Diagnosis not present

## 2020-03-19 DIAGNOSIS — Z8572 Personal history of non-Hodgkin lymphomas: Secondary | ICD-10-CM | POA: Diagnosis not present

## 2020-03-19 DIAGNOSIS — C211 Malignant neoplasm of anal canal: Secondary | ICD-10-CM | POA: Diagnosis not present

## 2020-03-19 MED ORDER — OXYCODONE 5 MG TABLET
ORAL_TABLET | Freq: Four times a day (QID) | ORAL | 0 refills | 8.00000 days | Status: CP | PRN
Start: 2020-03-19 — End: 2020-04-18

## 2020-03-19 MED FILL — CAPECITABINE 500 MG TABLET: ORAL | 10 days supply | Qty: 48 | Fill #1

## 2020-03-19 MED FILL — CAPECITABINE 500 MG TABLET: 10 days supply | Qty: 48 | Fill #1 | Status: AC

## 2020-03-19 NOTE — Unmapped (Signed)
03/19/2020    Dose Site Summary:  UJ:WJXB+JYN: 03/19/2020: 360/3,600 cGy  Rx:BST ANAL: : 0/1,440 cGy  WGN:FAOZH GTV: 03/19/2020: 360 cGy  Subjective/Assessment/Recommendations:    1. Fatigue:  2. Pain: is not managed with tylenol   3. Urinary Elimination:  4. Bowel Elimination: no issues.   5. Prescription Needs:  6. Psychosocial:  7. Other:

## 2020-03-19 NOTE — Unmapped (Signed)
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    ASSESSMENT: 360cGy of planned 5040cGy  Karnofsky/Lansky Performance Status: 80, Normal activity with effort; some signs or symptoms of disease (ECOG equivalent 1)  Chemotherapy/Systemic therapy:administered concurrently  Clinical Trial:   no    RECOMMENDATIONS:  1. Plan for Therapy: Continue treatment as planned  2. Skin: no issues  3. GI:  Some obstructive symptoms- discussed bowel regimen to help with this although will likely need to adjust this going forward  4. GU: No issues  5. Pain:  He does have cancer-related pain and has recently started pain medications (but has run out).  He does have a history of narcotic dependence but given the degree of his pain, pain medications are appropriate.  I have prescribed oxycodone #30 after reviewing the [place] controlled substances database  6. Nutrition:  Weight stable  7. Follow-up:  For treatment next week    SUBJECTIVE: First treatment with chemo was yesterday and he tolerated this well.  His biggest issue is perianal pain, particularly with bowel movements.  He does have some bleeding as well.  His stool is fairly hard- no loose stool/diarrhea.  He was prescribed Norco by his PCP to help with his pain last month- this was helping a lot but he ran out recently and his pain has been much more problematic.     PHYSICAL EXAM:  Vital Signs for this encounter:  There were no vitals taken for this visit.  Last weight:    Wt Readings from Last 4 Encounters:   03/18/20 79 kg (174 lb 2.6 oz)   02/14/20 78.1 kg (172 lb 3.2 oz)   01/08/20 80.1 kg (176 lb 9.4 oz)   09/12/18 70.6 kg (155 lb 10.3 oz)     General:  Alert and Orientated X 3.  No acute distress.    Skin: Not examined    I have reviewed the patient's dose delivery, dosimetry, lab tests, patient treatment set-up, port films, treatment parameters and x-rays.    Rayetta Humphrey, MD  Assistant Professor  [facility] Dept of Radiation Oncology  03/19/2020

## 2020-03-20 DIAGNOSIS — C211 Malignant neoplasm of anal canal: Secondary | ICD-10-CM | POA: Diagnosis not present

## 2020-03-20 DIAGNOSIS — Z510 Encounter for antineoplastic radiation therapy: Secondary | ICD-10-CM | POA: Diagnosis not present

## 2020-03-20 DIAGNOSIS — Z8572 Personal history of non-Hodgkin lymphomas: Secondary | ICD-10-CM | POA: Diagnosis not present

## 2020-03-20 DIAGNOSIS — I10 Essential (primary) hypertension: Secondary | ICD-10-CM | POA: Diagnosis not present

## 2020-03-20 DIAGNOSIS — Z9221 Personal history of antineoplastic chemotherapy: Secondary | ICD-10-CM | POA: Diagnosis not present

## 2020-03-20 DIAGNOSIS — Z923 Personal history of irradiation: Secondary | ICD-10-CM | POA: Diagnosis not present

## 2020-03-20 DIAGNOSIS — R911 Solitary pulmonary nodule: Secondary | ICD-10-CM | POA: Diagnosis not present

## 2020-03-20 DIAGNOSIS — Z87891 Personal history of nicotine dependence: Secondary | ICD-10-CM | POA: Diagnosis not present

## 2020-03-21 DIAGNOSIS — Z923 Personal history of irradiation: Secondary | ICD-10-CM | POA: Diagnosis not present

## 2020-03-21 DIAGNOSIS — Z510 Encounter for antineoplastic radiation therapy: Secondary | ICD-10-CM | POA: Diagnosis not present

## 2020-03-21 DIAGNOSIS — Z9221 Personal history of antineoplastic chemotherapy: Secondary | ICD-10-CM | POA: Diagnosis not present

## 2020-03-21 DIAGNOSIS — Z87891 Personal history of nicotine dependence: Secondary | ICD-10-CM | POA: Diagnosis not present

## 2020-03-21 DIAGNOSIS — Z8572 Personal history of non-Hodgkin lymphomas: Secondary | ICD-10-CM | POA: Diagnosis not present

## 2020-03-21 DIAGNOSIS — I10 Essential (primary) hypertension: Secondary | ICD-10-CM | POA: Diagnosis not present

## 2020-03-21 DIAGNOSIS — C211 Malignant neoplasm of anal canal: Secondary | ICD-10-CM | POA: Diagnosis not present

## 2020-03-21 DIAGNOSIS — R911 Solitary pulmonary nodule: Secondary | ICD-10-CM | POA: Diagnosis not present

## 2020-03-24 DIAGNOSIS — I10 Essential (primary) hypertension: Secondary | ICD-10-CM | POA: Diagnosis not present

## 2020-03-24 DIAGNOSIS — Z923 Personal history of irradiation: Secondary | ICD-10-CM | POA: Diagnosis not present

## 2020-03-24 DIAGNOSIS — C211 Malignant neoplasm of anal canal: Secondary | ICD-10-CM | POA: Diagnosis not present

## 2020-03-24 DIAGNOSIS — Z510 Encounter for antineoplastic radiation therapy: Secondary | ICD-10-CM | POA: Diagnosis not present

## 2020-03-24 DIAGNOSIS — Z87891 Personal history of nicotine dependence: Secondary | ICD-10-CM | POA: Diagnosis not present

## 2020-03-24 DIAGNOSIS — Z8572 Personal history of non-Hodgkin lymphomas: Secondary | ICD-10-CM | POA: Diagnosis not present

## 2020-03-24 DIAGNOSIS — R911 Solitary pulmonary nodule: Secondary | ICD-10-CM | POA: Diagnosis not present

## 2020-03-24 DIAGNOSIS — Z9221 Personal history of antineoplastic chemotherapy: Secondary | ICD-10-CM | POA: Diagnosis not present

## 2020-03-25 DIAGNOSIS — Z9221 Personal history of antineoplastic chemotherapy: Secondary | ICD-10-CM | POA: Diagnosis not present

## 2020-03-25 DIAGNOSIS — Z8572 Personal history of non-Hodgkin lymphomas: Secondary | ICD-10-CM | POA: Diagnosis not present

## 2020-03-25 DIAGNOSIS — Z510 Encounter for antineoplastic radiation therapy: Secondary | ICD-10-CM | POA: Diagnosis not present

## 2020-03-25 DIAGNOSIS — Z87891 Personal history of nicotine dependence: Secondary | ICD-10-CM | POA: Diagnosis not present

## 2020-03-25 DIAGNOSIS — C211 Malignant neoplasm of anal canal: Secondary | ICD-10-CM | POA: Diagnosis not present

## 2020-03-25 DIAGNOSIS — I10 Essential (primary) hypertension: Secondary | ICD-10-CM | POA: Diagnosis not present

## 2020-03-25 DIAGNOSIS — Z923 Personal history of irradiation: Secondary | ICD-10-CM | POA: Diagnosis not present

## 2020-03-25 DIAGNOSIS — R911 Solitary pulmonary nodule: Secondary | ICD-10-CM | POA: Diagnosis not present

## 2020-03-26 DIAGNOSIS — Z8572 Personal history of non-Hodgkin lymphomas: Secondary | ICD-10-CM | POA: Diagnosis not present

## 2020-03-26 DIAGNOSIS — Z9221 Personal history of antineoplastic chemotherapy: Secondary | ICD-10-CM | POA: Diagnosis not present

## 2020-03-26 DIAGNOSIS — Z87891 Personal history of nicotine dependence: Secondary | ICD-10-CM | POA: Diagnosis not present

## 2020-03-26 DIAGNOSIS — Z923 Personal history of irradiation: Secondary | ICD-10-CM | POA: Diagnosis not present

## 2020-03-26 DIAGNOSIS — C211 Malignant neoplasm of anal canal: Secondary | ICD-10-CM | POA: Diagnosis not present

## 2020-03-26 DIAGNOSIS — Z510 Encounter for antineoplastic radiation therapy: Secondary | ICD-10-CM | POA: Diagnosis not present

## 2020-03-26 DIAGNOSIS — R911 Solitary pulmonary nodule: Secondary | ICD-10-CM | POA: Diagnosis not present

## 2020-03-26 DIAGNOSIS — I10 Essential (primary) hypertension: Secondary | ICD-10-CM | POA: Diagnosis not present

## 2020-03-26 NOTE — Unmapped (Signed)
03/26/2020    Dose Site Summary:  x:Anus+Nod: 03/26/2020: 1,260/3,600 cGy  Rx:BST ANAL: : 0/1,440 cGy  VHQ:IONGE GTV: 03/26/2020: 1,260 cGy  Subjective/Assessment/Recommendations:  Nausea managed with meds.    1. Fatigue:some fatigue especially on weekend  2. Pain:  3. Urinary Elimination:  4. Bowel Elimination:  5. Prescription Needs:  6. Psychosocial:  7. Other:

## 2020-03-26 NOTE — Unmapped (Signed)
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    ASSESSMENT: 1260cGy of planned 5040cGy  Karnofsky/Lansky Performance Status: 80, Normal activity with effort; some signs or symptoms of disease (ECOG equivalent 1)  Chemotherapy/Systemic therapy:administered concurrently  Clinical Trial:   no    RECOMMENDATIONS:  1. Plan for Therapy: Continue treatment as planned  2. Skin: no issues  3. GI:  Some obstructive symptoms- discussed bowel regimen to help with this although will likely need to adjust this going forward  4. GU: No issues  5. Nausea:  Has prn compazine- no issues if he takes this prophylactically  6. Pain:  He does have cancer-related pain and has recently started pain medications (but has run out).  He does have a history of narcotic dependence but given the degree of his pain, pain medications are appropriate.  No refill today needed.  7. Nutrition:  Weight stable  8. Follow-up:  For treatment next week    SUBJECTIVE:  Doing OK today.  Feels like there's more perianal discomfort this week but pain is managed with oxycodone 2-3 tablets/day (alternating with tylenol).  Bowels are fine- no diarrhea yet.  He had some nausea when he forgot to take his compazine the other morning but otherwise no issues.      PHYSICAL EXAM:  Vital Signs for this encounter:  There were no vitals taken for this visit.  Last weight:    Wt Readings from Last 4 Encounters:   03/18/20 79 kg (174 lb 2.6 oz)   02/14/20 78.1 kg (172 lb 3.2 oz)   01/08/20 80.1 kg (176 lb 9.4 oz)   09/12/18 70.6 kg (155 lb 10.3 oz)     General:  Alert and Orientated X 3.  No acute distress.    Skin: No perianal erythema- tumor noted extending from anal canal unchanged    I have reviewed the patient's dose delivery, dosimetry, lab tests, patient treatment set-up, port films, treatment parameters and x-rays.    Rayetta Humphrey, MD  Assistant Professor  [facility] Dept of Radiation Oncology  03/26/2020

## 2020-03-27 ENCOUNTER — Ambulatory Visit: Admit: 2020-03-27 | Discharge: 2020-03-28 | Payer: MEDICARE

## 2020-03-27 DIAGNOSIS — C210 Malignant neoplasm of anus, unspecified: Principal | ICD-10-CM

## 2020-03-27 DIAGNOSIS — R110 Nausea: Principal | ICD-10-CM

## 2020-03-27 DIAGNOSIS — Z09 Encounter for follow-up examination after completed treatment for conditions other than malignant neoplasm: Principal | ICD-10-CM

## 2020-03-27 DIAGNOSIS — B20 Human immunodeficiency virus [HIV] disease: Principal | ICD-10-CM

## 2020-03-27 DIAGNOSIS — C8599 Non-Hodgkin lymphoma, unspecified, extranodal and solid organ sites: Principal | ICD-10-CM

## 2020-03-27 DIAGNOSIS — T451X5A Adverse effect of antineoplastic and immunosuppressive drugs, initial encounter: Principal | ICD-10-CM

## 2020-03-27 DIAGNOSIS — C211 Malignant neoplasm of anal canal: Secondary | ICD-10-CM | POA: Diagnosis not present

## 2020-03-27 DIAGNOSIS — Z8572 Personal history of non-Hodgkin lymphomas: Secondary | ICD-10-CM | POA: Diagnosis not present

## 2020-03-27 DIAGNOSIS — R911 Solitary pulmonary nodule: Secondary | ICD-10-CM | POA: Diagnosis not present

## 2020-03-27 DIAGNOSIS — Z510 Encounter for antineoplastic radiation therapy: Secondary | ICD-10-CM | POA: Diagnosis not present

## 2020-03-27 DIAGNOSIS — Z87891 Personal history of nicotine dependence: Secondary | ICD-10-CM | POA: Diagnosis not present

## 2020-03-27 DIAGNOSIS — I10 Essential (primary) hypertension: Secondary | ICD-10-CM | POA: Diagnosis not present

## 2020-03-27 DIAGNOSIS — Z923 Personal history of irradiation: Secondary | ICD-10-CM | POA: Diagnosis not present

## 2020-03-27 DIAGNOSIS — Z9221 Personal history of antineoplastic chemotherapy: Secondary | ICD-10-CM | POA: Diagnosis not present

## 2020-03-27 NOTE — Unmapped (Signed)
[facility] GI MEDICAL ONCOLOGY     REFERRING PROVIDER:   Sharyn Lull, Md  [address]  [place],  [place] [address]    PRIMARY CARE PROVIDER:  DAVID Ezra Sites, MD [ID]  [address] [facility] WEST ??? INTERNAL MEDICINE  [place] [address]    CONSULTING PROVIDERS  Dr. Rayetta Mills, [facility] Radiation Oncology  ____________________________________________________________________    CANCER HISTORY  Diagnosis stage II SCC of anus  1. 02/2020 mod diff squamous cell carcinoma, 3.6 cm.   2. 03/18/2020 Mitomycin/Capecitabine  ____________________________________________________________________    ASSESSMENT  1. cT2N0M0 anus/anal canal SCC  2. Indeterminate pulmonary nodule, likely benign given prior history. PET pending  3. Well controlled HIV  4. MDD/GAD, managed but anxiety in particular remains active  5. History of lymphoma and prior CHOP  6. ECOG PS 0  7. Chemo induced nausea      RECOMMENDATIONS  1. Continue Curative intent chemoradiotherapy with 5FU (capecitabine) + mitomycin   -continue capecitabine 1500mg  BID on radiation days  2. Compazine prn  3. RTC 2 weeks for labs and visit    DISCUSSION  Damon Mills is doing well so far with the beginning of his treatment.  Continue chemo rads.  We will continue to see him every other week during his treatment.  ______________________________________________________________________  HISTORY     Damon Mills is seen today at the [facility] for ongoing management regarding squamous cell carcinoma of anus/anal canal.  He started chemorads last week.  Feels first mitomycin infusion went well.  Mild nausea, takes nausea med 1-2 times per day which helps.  Appetite is good, weight is stable.  No significant pain issues right now.  Bowels are good.  No diarrhea.  Denies mucositis.  Denies hand/foot syndrome.    MEDICAL HISTORY  1. HIV, diagnosed 9. 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 Major depressive disorder; generalized anxiety disorder   3. Lymphoma, [facility] + involved field RT, 2006    Allergies   Allergen Reactions   ??? Hivid [Zalcitabine] Anaphylaxis, Rash and Nausea And Vomiting     Patient unsure of this med.  Patient unsure of this med.   ??? Ziagen [Abacavir] Anaphylaxis, Rash and Nausea And Vomiting   ??? Cymbalta [Duloxetine] Other (See Comments) and Nausea And Vomiting     Possible seizure like event   ??? Duloxetine Hcl Nausea And Vomiting     Possible seizure like event     Medications reviewed in the EMR    FAMILY HISTORY  No cancer    SOCIAL HISTORY  LOng-term partner, Damon Mills. He does not work outside the home, very active in his garden, moving yard, maintaining home. Used to be very active in Education officer, environmental, did a lot of teaching to youth. Stopped with his lymphoma dx. Stopped smoking in 2007 as well. Rare EtOH. No drugs.     REVIEW OF SYSTEMS  The remainder of a comprehensive 10 systems review is negative.    PHYSICAL EXAM  Vitals:    03/27/20 1002   BP: 129/86   Pulse: 83   Resp: 24   Temp: 36.8 ??C (98.2 ??F)   SpO2: 98%       GENERAL: well developed, well nourished, in no distress  PSYCH: full and appropriate range of affect with good insight and judgement  HEENT: NCAT, pupils equal, sclerae anicteric  EXT: warm and well perfused, no edema  SKIN: No  rashes    OBJECTIVE DATA  LABS  Reviewed in emr from last week    RADIOLOGY RESULTS   None today

## 2020-03-28 DIAGNOSIS — Z87891 Personal history of nicotine dependence: Secondary | ICD-10-CM | POA: Diagnosis not present

## 2020-03-28 DIAGNOSIS — Z510 Encounter for antineoplastic radiation therapy: Secondary | ICD-10-CM | POA: Diagnosis not present

## 2020-03-28 DIAGNOSIS — Z923 Personal history of irradiation: Secondary | ICD-10-CM | POA: Diagnosis not present

## 2020-03-28 DIAGNOSIS — Z9221 Personal history of antineoplastic chemotherapy: Secondary | ICD-10-CM | POA: Diagnosis not present

## 2020-03-28 DIAGNOSIS — C211 Malignant neoplasm of anal canal: Secondary | ICD-10-CM | POA: Diagnosis not present

## 2020-03-28 DIAGNOSIS — I10 Essential (primary) hypertension: Secondary | ICD-10-CM | POA: Diagnosis not present

## 2020-03-28 DIAGNOSIS — R911 Solitary pulmonary nodule: Secondary | ICD-10-CM | POA: Diagnosis not present

## 2020-03-28 DIAGNOSIS — Z8572 Personal history of non-Hodgkin lymphomas: Secondary | ICD-10-CM | POA: Diagnosis not present

## 2020-03-31 DIAGNOSIS — I10 Essential (primary) hypertension: Secondary | ICD-10-CM | POA: Diagnosis not present

## 2020-03-31 DIAGNOSIS — Z8572 Personal history of non-Hodgkin lymphomas: Secondary | ICD-10-CM | POA: Diagnosis not present

## 2020-03-31 DIAGNOSIS — Z9221 Personal history of antineoplastic chemotherapy: Secondary | ICD-10-CM | POA: Diagnosis not present

## 2020-03-31 DIAGNOSIS — Z87891 Personal history of nicotine dependence: Secondary | ICD-10-CM | POA: Diagnosis not present

## 2020-03-31 DIAGNOSIS — Z510 Encounter for antineoplastic radiation therapy: Secondary | ICD-10-CM | POA: Diagnosis not present

## 2020-03-31 DIAGNOSIS — C211 Malignant neoplasm of anal canal: Secondary | ICD-10-CM | POA: Diagnosis not present

## 2020-03-31 DIAGNOSIS — R911 Solitary pulmonary nodule: Secondary | ICD-10-CM | POA: Diagnosis not present

## 2020-03-31 DIAGNOSIS — Z923 Personal history of irradiation: Secondary | ICD-10-CM | POA: Diagnosis not present

## 2020-04-01 DIAGNOSIS — Z510 Encounter for antineoplastic radiation therapy: Secondary | ICD-10-CM | POA: Diagnosis not present

## 2020-04-01 DIAGNOSIS — Z8572 Personal history of non-Hodgkin lymphomas: Secondary | ICD-10-CM | POA: Diagnosis not present

## 2020-04-01 DIAGNOSIS — C211 Malignant neoplasm of anal canal: Secondary | ICD-10-CM | POA: Diagnosis not present

## 2020-04-01 DIAGNOSIS — Z9221 Personal history of antineoplastic chemotherapy: Secondary | ICD-10-CM | POA: Diagnosis not present

## 2020-04-01 DIAGNOSIS — Z923 Personal history of irradiation: Secondary | ICD-10-CM | POA: Diagnosis not present

## 2020-04-01 DIAGNOSIS — Z87891 Personal history of nicotine dependence: Secondary | ICD-10-CM | POA: Diagnosis not present

## 2020-04-01 DIAGNOSIS — I10 Essential (primary) hypertension: Secondary | ICD-10-CM | POA: Diagnosis not present

## 2020-04-01 DIAGNOSIS — R911 Solitary pulmonary nodule: Secondary | ICD-10-CM | POA: Diagnosis not present

## 2020-04-02 DIAGNOSIS — C210 Malignant neoplasm of anus, unspecified: Principal | ICD-10-CM

## 2020-04-02 DIAGNOSIS — Z8572 Personal history of non-Hodgkin lymphomas: Secondary | ICD-10-CM | POA: Diagnosis not present

## 2020-04-02 DIAGNOSIS — Z510 Encounter for antineoplastic radiation therapy: Secondary | ICD-10-CM | POA: Diagnosis not present

## 2020-04-02 DIAGNOSIS — Z87891 Personal history of nicotine dependence: Secondary | ICD-10-CM | POA: Diagnosis not present

## 2020-04-02 DIAGNOSIS — C211 Malignant neoplasm of anal canal: Secondary | ICD-10-CM | POA: Diagnosis not present

## 2020-04-02 DIAGNOSIS — I10 Essential (primary) hypertension: Secondary | ICD-10-CM | POA: Diagnosis not present

## 2020-04-02 DIAGNOSIS — R911 Solitary pulmonary nodule: Secondary | ICD-10-CM | POA: Diagnosis not present

## 2020-04-02 DIAGNOSIS — Z923 Personal history of irradiation: Secondary | ICD-10-CM | POA: Diagnosis not present

## 2020-04-02 DIAGNOSIS — Z9221 Personal history of antineoplastic chemotherapy: Secondary | ICD-10-CM | POA: Diagnosis not present

## 2020-04-02 MED ORDER — OXYCODONE 5 MG TABLET
ORAL_TABLET | ORAL | 0 refills | 9 days | Status: CP | PRN
Start: 2020-04-02 — End: 2020-05-02

## 2020-04-02 NOTE — Unmapped (Signed)
04/02/2020    Dose Site Summary:  YQ:MVHQ+ION: 04/02/2020: 2,160/3,600 cGy  Rx:BST ANAL: : 0/1,440 cGy  GEX:BMWUX GTV: 04/02/2020: 2,160 cGy  Subjective/Assessment/Recommendations:  Has mouth sores- given instructions on baking soda rinses.   1. Fatigue: getting tired faster, rests during the day.   2. Pain: managed with oxycodone 5mg  everyb 5 hours  3. Urinary Elimination: no issues.   4. Bowel Elimination: taking immodium for diarhea that helped. Given information on foods to avoid and treatment for diarhea.   5. Prescription Needs: refill oxycodone.   6. Psychosocial:  7. Other: Given sitz bath with instructions on use.

## 2020-04-02 NOTE — Unmapped (Signed)
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    ASSESSMENT: 2160cGy of planned 5040cGy  Karnofsky/Lansky Performance Status: 80, Normal activity with effort; some signs or symptoms of disease (ECOG equivalent 1)  Chemotherapy/Systemic therapy:administered concurrently  Clinical Trial:   no    RECOMMENDATIONS:  1. Plan for Therapy: Continue treatment as planned  2. Skin: no issues  3. GI:  More diarrhea- discussed use of prn imodium and dietary modifications  4. GU: No issues  5. Nausea:  Has prn compazine- no issues if he takes this prophylactically  6. Pain:  He does have cancer-related pain and has recently started pain medications.  He does have a history of narcotic dependence but given the degree of his pain, pain medications are appropriate.  Refill provided in clinic today after reviewing [place] controlled substances database  7. Nutrition:  Weight stable  8. Follow-up:  For treatment next week    SUBJECTIVE:  More issues this week.  Had issues with diarrhea over the weekend and finally getting this under control with prn imodium.  Ran out of pain medications yesterday so having more pain today.  Taking oxycodone 2-3 times/day but finds that it only lasts ~4 hours.  Some mild urinary frequency/intermittent stream but not too bothersome.  Overall feeling very tired    PHYSICAL EXAM:  Vital Signs for this encounter:  Wt 77.3 kg (170 lb 6.4 oz)  - BMI 22.48 kg/m??   Last weight:    Wt Readings from Last 4 Encounters:   04/02/20 77.3 kg (170 lb 6.4 oz)   03/27/20 77.9 kg (171 lb 12.8 oz)   03/18/20 79 kg (174 lb 2.6 oz)   02/14/20 78.1 kg (172 lb 3.2 oz)     General:  Alert and Orientated X 3.  No acute distress.    Skin: mild perianal erythema- tumor noted extending from anal canal unchanged    I have reviewed the patient's dose delivery, dosimetry, lab tests, patient treatment set-up, port films, treatment parameters and x-rays.    Rayetta Humphrey, MD  Assistant Professor  [facility] Dept of Radiation Oncology  04/02/2020

## 2020-04-03 DIAGNOSIS — C211 Malignant neoplasm of anal canal: Secondary | ICD-10-CM | POA: Diagnosis not present

## 2020-04-03 DIAGNOSIS — Z87891 Personal history of nicotine dependence: Secondary | ICD-10-CM | POA: Diagnosis not present

## 2020-04-03 DIAGNOSIS — R911 Solitary pulmonary nodule: Secondary | ICD-10-CM | POA: Diagnosis not present

## 2020-04-03 DIAGNOSIS — I10 Essential (primary) hypertension: Secondary | ICD-10-CM | POA: Diagnosis not present

## 2020-04-03 DIAGNOSIS — Z8572 Personal history of non-Hodgkin lymphomas: Secondary | ICD-10-CM | POA: Diagnosis not present

## 2020-04-03 DIAGNOSIS — Z510 Encounter for antineoplastic radiation therapy: Secondary | ICD-10-CM | POA: Diagnosis not present

## 2020-04-03 DIAGNOSIS — Z9221 Personal history of antineoplastic chemotherapy: Secondary | ICD-10-CM | POA: Diagnosis not present

## 2020-04-03 DIAGNOSIS — Z923 Personal history of irradiation: Secondary | ICD-10-CM | POA: Diagnosis not present

## 2020-04-04 DIAGNOSIS — C211 Malignant neoplasm of anal canal: Secondary | ICD-10-CM | POA: Diagnosis not present

## 2020-04-04 DIAGNOSIS — R911 Solitary pulmonary nodule: Secondary | ICD-10-CM | POA: Diagnosis not present

## 2020-04-04 DIAGNOSIS — Z923 Personal history of irradiation: Secondary | ICD-10-CM | POA: Diagnosis not present

## 2020-04-04 DIAGNOSIS — Z8572 Personal history of non-Hodgkin lymphomas: Secondary | ICD-10-CM | POA: Diagnosis not present

## 2020-04-04 DIAGNOSIS — I10 Essential (primary) hypertension: Secondary | ICD-10-CM | POA: Diagnosis not present

## 2020-04-04 DIAGNOSIS — Z87891 Personal history of nicotine dependence: Secondary | ICD-10-CM | POA: Diagnosis not present

## 2020-04-04 DIAGNOSIS — Z510 Encounter for antineoplastic radiation therapy: Secondary | ICD-10-CM | POA: Diagnosis not present

## 2020-04-04 DIAGNOSIS — Z9221 Personal history of antineoplastic chemotherapy: Secondary | ICD-10-CM | POA: Diagnosis not present

## 2020-04-07 DIAGNOSIS — Z8572 Personal history of non-Hodgkin lymphomas: Secondary | ICD-10-CM | POA: Diagnosis not present

## 2020-04-07 DIAGNOSIS — Z510 Encounter for antineoplastic radiation therapy: Secondary | ICD-10-CM | POA: Diagnosis not present

## 2020-04-07 DIAGNOSIS — Z923 Personal history of irradiation: Secondary | ICD-10-CM | POA: Diagnosis not present

## 2020-04-07 DIAGNOSIS — Z9221 Personal history of antineoplastic chemotherapy: Secondary | ICD-10-CM | POA: Diagnosis not present

## 2020-04-07 DIAGNOSIS — Z87891 Personal history of nicotine dependence: Secondary | ICD-10-CM | POA: Diagnosis not present

## 2020-04-07 DIAGNOSIS — I10 Essential (primary) hypertension: Secondary | ICD-10-CM | POA: Diagnosis not present

## 2020-04-07 DIAGNOSIS — R911 Solitary pulmonary nodule: Secondary | ICD-10-CM | POA: Diagnosis not present

## 2020-04-07 DIAGNOSIS — C211 Malignant neoplasm of anal canal: Secondary | ICD-10-CM | POA: Diagnosis not present

## 2020-04-07 MED ORDER — ONDANSETRON HCL 8 MG TABLET
ORAL_TABLET | Freq: Three times a day (TID) | ORAL | 1 refills | 10 days | Status: CP | PRN
Start: 2020-04-07 — End: ?

## 2020-04-07 NOTE — Unmapped (Signed)
Assessment     Symptom and/or toxicity review:  ??? Nausea (Grade 1): Intermittent nausea and uses prochlorperazine 1-2 times a day as needed. Still has nausea in the morning when waking up and after radiation, after meals. He will start ondansetron 8 mg PO daily in the morning prior to radiation. Maintaining PO intake and adequate hydration.   ??? Intermittent diarrhea (Grade 1) and constipation (Grade 1) - occasional diarrhea well controlled with use of PRN loperamide and intermittent constipation well controlled with use of Miralax 17 g PO daily.  ??? Fatigue (Grade 1) manageable and improved with rest.   ??? Patient denies concerns with:  o No vomiting, no HFS, shortness of breath, fevers, signs of infection.     Medication Reconciliation and Drug-Drug Interactions: Medication list reviewed in Epic. Patient informed to notify the team prior to starting any new prescription or over the counter medications or supplements.    Adherence:  ??? Missed doses: 0   ??? Taking BID within 30 minutes of a meal Mon-Fri as instructed    Plan     1. Anal cancer: [name] continues on capecitabine 1500 mg (3 tablets) BID on Mon-Fri (radiation days) + IV mitomycin + radiation therapy.   2. Nausea:   o Start ondansetron 8 mg (1 tablet) PO once daily every morning (Rx sent to local pharmacy)  o Continue prochlorperazine 10 mg (1 tablet) every 6 hours PRN nausea/vomiting  3. Constipation - continue Miralax 17 g PO once daily PRN constipation  4. Diarrhea - continue loperamide 2 mg every 4-6 hours PRN diarrhea    Patient's questions were answered and patient verbalized understanding of the plan.     Follow up: Clinic visit with Eula Fried, ANP on 7/29    Subjective & Objective     HPI:  Damon Mills is a 59 y.o. male with anal squamous cell carcinoma who I spoke with regarding oral chemotherapy capecitabine.     Regimen: Capecitabine 1500 mg (3 tablets) BID on Mon-Fri (radiation days) + IV mitomycin + radiation therapy   Start date: 03/18/20 Wt Readings from Last 3 Encounters:   04/02/20 77.3 kg (170 lb 6.4 oz)   03/27/20 77.9 kg (171 lb 12.8 oz)   03/18/20 79 kg (174 lb 2.6 oz)       Pertinent labs:  Lab Results   Component Value Date    WBC 6.7 03/18/2020    HGB 14.6 03/18/2020    HCT 42.0 03/18/2020    PLT 195 03/18/2020       Lab Results   Component Value Date    NA 138 03/18/2020    K 3.8 03/18/2020    CL 106 03/18/2020    CO2 25.6 03/18/2020    BUN 18 03/18/2020    CREATININE 1.18 (H) 03/18/2020    GLU 116 03/18/2020    CALCIUM 9.7 03/18/2020       Lab Results   Component Value Date    BILITOT 0.3 03/18/2020    PROT 7.0 03/18/2020    ALBUMIN 4.1 03/18/2020    ALT 38 03/18/2020    AST 28 03/18/2020    ALKPHOS 82 03/18/2020       No results found for: LABPROT, INR, APTT    Ronnald Collum, PharmD  Hematology/Oncology Clinical Pharmacist  Pager [phone]      I spent 15 minutes on the phone with the patient on the date of service. I spent an additional 5 minutes on pre- and post-visit activities on the  date of service.     The patient was physically located in [place] or a state in which I am permitted to provide care. The patient and/or parent/guardian understood that s/he may incur co-pays and cost sharing, and agreed to the telemedicine visit. The visit was reasonable and appropriate under the circumstances given the patient's presentation at the time.    The patient and/or parent/guardian has been advised of the potential risks and limitations of this mode of treatment (including, but not limited to, the absence of in-person examination) and has agreed to be treated using telemedicine. The patient's/patient's family's questions regarding telemedicine have been answered.     If the visit was completed in an ambulatory setting, the patient and/or parent/guardian has also been advised to contact their provider???s office for worsening conditions, and seek emergency medical treatment and/or call 911 if the patient deems either necessary.

## 2020-04-08 DIAGNOSIS — Z923 Personal history of irradiation: Secondary | ICD-10-CM | POA: Diagnosis not present

## 2020-04-08 DIAGNOSIS — I10 Essential (primary) hypertension: Secondary | ICD-10-CM | POA: Diagnosis not present

## 2020-04-08 DIAGNOSIS — R911 Solitary pulmonary nodule: Secondary | ICD-10-CM | POA: Diagnosis not present

## 2020-04-08 DIAGNOSIS — Z9221 Personal history of antineoplastic chemotherapy: Secondary | ICD-10-CM | POA: Diagnosis not present

## 2020-04-08 DIAGNOSIS — Z8572 Personal history of non-Hodgkin lymphomas: Secondary | ICD-10-CM | POA: Diagnosis not present

## 2020-04-08 DIAGNOSIS — Z510 Encounter for antineoplastic radiation therapy: Secondary | ICD-10-CM | POA: Diagnosis not present

## 2020-04-08 DIAGNOSIS — C211 Malignant neoplasm of anal canal: Secondary | ICD-10-CM | POA: Diagnosis not present

## 2020-04-08 DIAGNOSIS — Z87891 Personal history of nicotine dependence: Secondary | ICD-10-CM | POA: Diagnosis not present

## 2020-04-09 DIAGNOSIS — Z510 Encounter for antineoplastic radiation therapy: Secondary | ICD-10-CM | POA: Diagnosis not present

## 2020-04-09 DIAGNOSIS — C211 Malignant neoplasm of anal canal: Secondary | ICD-10-CM | POA: Diagnosis not present

## 2020-04-09 DIAGNOSIS — Z923 Personal history of irradiation: Secondary | ICD-10-CM | POA: Diagnosis not present

## 2020-04-09 DIAGNOSIS — Z87891 Personal history of nicotine dependence: Secondary | ICD-10-CM | POA: Diagnosis not present

## 2020-04-09 DIAGNOSIS — I10 Essential (primary) hypertension: Secondary | ICD-10-CM | POA: Diagnosis not present

## 2020-04-09 DIAGNOSIS — R911 Solitary pulmonary nodule: Secondary | ICD-10-CM | POA: Diagnosis not present

## 2020-04-09 DIAGNOSIS — Z9221 Personal history of antineoplastic chemotherapy: Secondary | ICD-10-CM | POA: Diagnosis not present

## 2020-04-09 DIAGNOSIS — Z8572 Personal history of non-Hodgkin lymphomas: Secondary | ICD-10-CM | POA: Diagnosis not present

## 2020-04-09 MED ORDER — OXYCODONE 5 MG TABLET
ORAL_TABLET | ORAL | 0 refills | 9 days | Status: CP | PRN
Start: 2020-04-09 — End: 2020-05-09

## 2020-04-09 NOTE — Unmapped (Signed)
04/09/2020    Dose Site Summary:  ZO:XWRU+EAV: 04/09/2020: 3,060/3,600 cGy  Rx:BST ANAL: : 0/1,440 cGy  WUJ:WJXBJ GTV: 04/09/2020: 3,060 cGy  Subjective/Assessment/Recommendations:    1. Fatigue: fatigue is worse,  2. Pain: managed with oxycodone 5mg  every 4 hours. Gets relief from warm baths with Epson salts.   3. Urinary Elimination:  4. Bowel Elimination: has not needed immodium for 4 days. Has started taking zofran for nausea with the xeloda.  5. Prescription Needs: pain med.   6. Psychosocial:   7. Other:

## 2020-04-09 NOTE — Unmapped (Signed)
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    ASSESSMENT: 3060cGy of planned 5040cGy  Karnofsky/Lansky Performance Status: 80, Normal activity with effort; some signs or symptoms of disease (ECOG equivalent 1)  Chemotherapy/Systemic therapy:administered concurrently  Clinical Trial:   no    RECOMMENDATIONS:  1. Plan for Therapy: Continue treatment as planned  2. Skin: no issues  3. GI:  No more diarrhea this week- discussed bowel regimen and emphasized that he does not need imodium currently  4. GU: No issues  5. Nausea:  Switching to prn zofran (has compazine as well)- this manages nausea reasonably well  6. Pain:  He does have cancer-related pain and has recently started pain medications.  He does have a history of narcotic dependence but given the degree of his pain, pain medications are appropriate.  He will likely run out early next week- refill provided in clinic today after reviewing [place] controlled substances database  7. Nutrition:  Emphasized importance of hydration going forward  8. Follow-up:  I will see for status check next week and will also coordinate a nurse visit earlier in the week to make sure he is doing OK.    SUBJECTIVE:  Doing OK today- skin irritation getting worse- lots of itching and discomfort with bowel movements.  He was having issues with diarrhea but now more on the constipated side- no longer taking imodium.  Some issues with nausea- switched over to zofran yesterday for this.  Eating OK.  His perianal pain is managed reasonably well with oxycodone 5mg  q4hrs    PHYSICAL EXAM:  Vital Signs for this encounter:  There were no vitals taken for this visit.  Last weight:    Wt Readings from Last 4 Encounters:   04/02/20 77.3 kg (170 lb 6.4 oz)   03/27/20 77.9 kg (171 lb 12.8 oz)   03/18/20 79 kg (174 lb 2.6 oz)   02/14/20 78.1 kg (172 lb 3.2 oz)     General:  Alert and Orientated X 3.  No acute distress.    Skin: Increased perianal erythema with focal areas of skin breakdown    I have reviewed the patient's dose delivery, dosimetry, lab tests, patient treatment set-up, port films, treatment parameters and x-rays.    Rayetta Humphrey, MD  Assistant Professor  [facility] Dept of Radiation Oncology  04/09/2020

## 2020-04-10 ENCOUNTER — Ambulatory Visit: Admit: 2020-04-10 | Discharge: 2020-04-10 | Payer: MEDICARE

## 2020-04-10 DIAGNOSIS — G893 Neoplasm related pain (acute) (chronic): Principal | ICD-10-CM

## 2020-04-10 DIAGNOSIS — C8599 Non-Hodgkin lymphoma, unspecified, extranodal and solid organ sites: Principal | ICD-10-CM

## 2020-04-10 DIAGNOSIS — B20 Human immunodeficiency virus [HIV] disease: Principal | ICD-10-CM

## 2020-04-10 DIAGNOSIS — C210 Malignant neoplasm of anus, unspecified: Principal | ICD-10-CM

## 2020-04-10 DIAGNOSIS — Z09 Encounter for follow-up examination after completed treatment for conditions other than malignant neoplasm: Principal | ICD-10-CM

## 2020-04-10 DIAGNOSIS — Z510 Encounter for antineoplastic radiation therapy: Secondary | ICD-10-CM | POA: Diagnosis not present

## 2020-04-10 DIAGNOSIS — Z87891 Personal history of nicotine dependence: Secondary | ICD-10-CM | POA: Diagnosis not present

## 2020-04-10 DIAGNOSIS — R911 Solitary pulmonary nodule: Secondary | ICD-10-CM | POA: Diagnosis not present

## 2020-04-10 DIAGNOSIS — I10 Essential (primary) hypertension: Secondary | ICD-10-CM | POA: Diagnosis not present

## 2020-04-10 DIAGNOSIS — Z8572 Personal history of non-Hodgkin lymphomas: Secondary | ICD-10-CM | POA: Diagnosis not present

## 2020-04-10 DIAGNOSIS — C211 Malignant neoplasm of anal canal: Secondary | ICD-10-CM | POA: Diagnosis not present

## 2020-04-10 DIAGNOSIS — Z9221 Personal history of antineoplastic chemotherapy: Secondary | ICD-10-CM | POA: Diagnosis not present

## 2020-04-10 DIAGNOSIS — Z923 Personal history of irradiation: Secondary | ICD-10-CM | POA: Diagnosis not present

## 2020-04-10 LAB — CBC W/ AUTO DIFF
BASOPHILS ABSOLUTE COUNT: 0 10*9/L (ref 0.0–0.1)
BASOPHILS RELATIVE PERCENT: 0.2 %
EOSINOPHILS RELATIVE PERCENT: 1.1 %
HEMATOCRIT: 36.9 % — ABNORMAL LOW (ref 38.0–50.0)
HEMOGLOBIN: 12.8 g/dL — ABNORMAL LOW (ref 13.5–17.5)
LYMPHOCYTES ABSOLUTE COUNT: 0.4 10*9/L — ABNORMAL LOW (ref 0.7–4.0)
LYMPHOCYTES RELATIVE PERCENT: 14.8 %
MEAN CORPUSCULAR HEMOGLOBIN CONC: 34.7 g/dL (ref 30.0–36.0)
MEAN CORPUSCULAR HEMOGLOBIN: 32.5 pg (ref 26.0–34.0)
MEAN CORPUSCULAR VOLUME: 93.6 fL (ref 81.0–95.0)
MEAN PLATELET VOLUME: 7 fL (ref 7.0–10.0)
MONOCYTES ABSOLUTE COUNT: 0.4 10*9/L (ref 0.1–1.0)
MONOCYTES RELATIVE PERCENT: 14.3 %
NEUTROPHILS ABSOLUTE COUNT: 2.1 10*9/L (ref 1.7–7.7)
NEUTROPHILS RELATIVE PERCENT: 69.6 %
NUCLEATED RED BLOOD CELLS: 0 /100{WBCs} (ref <=4)
RED BLOOD CELL COUNT: 3.94 10*12/L — ABNORMAL LOW (ref 4.32–5.72)
RED CELL DISTRIBUTION WIDTH: 13.3 % (ref 12.0–15.0)

## 2020-04-10 LAB — COMPREHENSIVE METABOLIC PANEL
ALBUMIN: 3.9 g/dL (ref 3.4–5.0)
ALKALINE PHOSPHATASE: 77 U/L (ref 46–116)
ALT (SGPT): 36 U/L (ref 10–49)
ANION GAP: 5 mmol/L (ref 5–14)
AST (SGOT): 25 U/L (ref <=34)
BILIRUBIN TOTAL: 0.7 mg/dL (ref 0.3–1.2)
BLOOD UREA NITROGEN: 17 mg/dL (ref 9–23)
CALCIUM: 9.5 mg/dL (ref 8.7–10.4)
CHLORIDE: 105 mmol/L (ref 98–107)
CO2: 29.1 mmol/L (ref 20.0–31.0)
CREATININE: 1.2 mg/dL — ABNORMAL HIGH
EGFR CKD-EPI AA MALE: 77 mL/min/{1.73_m2} (ref >=60)
EGFR CKD-EPI NON-AA MALE: 66 mL/min/{1.73_m2} (ref >=60)
GLUCOSE RANDOM: 153 mg/dL (ref 70–179)
POTASSIUM: 3.7 mmol/L (ref 3.4–4.5)
PROTEIN TOTAL: 6.7 g/dL (ref 5.7–8.2)
SODIUM: 139 mmol/L (ref 135–145)

## 2020-04-10 LAB — GLUCOSE RANDOM: Glucose:MCnc:Pt:Ser/Plas:Qn:: 153

## 2020-04-10 LAB — WBC ADJUSTED: Leukocytes:NCnc:Pt:Bld:Qn:: 2.9 — ABNORMAL LOW

## 2020-04-10 NOTE — Unmapped (Signed)
[facility] GI MEDICAL ONCOLOGY     REFERRING PROVIDER:   Sharyn Lull, Md  [address]  [place],  [place] [address]    PRIMARY CARE PROVIDER:  DAVID Ezra Sites, MD [ID]  [address] [facility] ??? INTERNAL MEDICINE  [place] [address]    CONSULTING PROVIDERS  Dr. Rayetta Humphrey, [facility] Radiation Oncology  ____________________________________________________________________    CANCER HISTORY  Diagnosis stage II SCC of anus  1. 02/2020 mod diff squamous cell carcinoma, 3.6 cm.   2. 03/18/2020 Mitomycin/Capecitabine  ____________________________________________________________________    ASSESSMENT  1. cT2N0M0 anus/anal canal SCC  2. Indeterminate pulmonary nodule, likely benign given prior history. PET pending  3. Well controlled HIV  4. MDD/GAD, managed but anxiety in particular remains active  5. History of lymphoma and prior CHOP  6. ECOG PS 0  7. Chemo induced nausea      RECOMMENDATIONS  1. Continue Curative intent chemoradiotherapy with 5FU (capecitabine) + mitomycin.     -continue capecitabine 1500mg  BID on radiation days  2. zofran prn, Compazine prn - alternating  3. RTC 2 weeks for labs and visit, mon for next mitomycin    DISCUSSION  [name] is starting to have progressive side effects from treatment with skin irritation and pain.  He feels like he is able to manage this okay for now.  He is doing sits baths at home.  Will recheck labs prior to his next cycle of mitomycin.  He will finish up radiation in about 2 weeks he knows to stop Xeloda on his last day of radiation.  I will plan to do an end of treatment visit with him at that time.  ______________________________________________________________________  HISTORY     Damon Mills is seen today at the [facility] for ongoing management regarding squamous cell carcinoma of anus/anal canal.  He continues on chemo rads.  Taking Xeloda and feels like he is tolerating this fairly well.  Nausea is well managed with Zofran and Compazine.  He has been eating fairly well.  His weight is stable.  He has now started to have some skin irritation and discomfort, he has been started on oxycodone taking every 4 hours and feels he is managing this ok at this time.  He is also taking Tylenol in between.  No significant diarrhea.  Denies mucositis.  Denies hand/foot syndrome.    MEDICAL HISTORY  1. HIV, diagnosed 37. 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 Major depressive disorder; generalized anxiety disorder   3. Lymphoma, RCHOP + involved field RT, 2006    Allergies   Allergen Reactions   ??? Hivid [Zalcitabine] Anaphylaxis, Rash and Nausea And Vomiting     Patient unsure of this med.  Patient unsure of this med.   ??? Ziagen [Abacavir] Anaphylaxis, Rash and Nausea And Vomiting   ??? Cymbalta [Duloxetine] Other (See Comments) and Nausea And Vomiting     Possible seizure like event   ??? Duloxetine Hcl Nausea And Vomiting     Possible seizure like event     Medications reviewed in the EMR    FAMILY HISTORY  No cancer    SOCIAL HISTORY  LOng-term partner, [name]. He does not work outside the home, very active in his garden, moving yard, maintaining home. Used to be very active in Education officer, environmental, did a lot of teaching to youth. Stopped with his lymphoma dx. Stopped smoking in 2007 as well.  Rare EtOH. No drugs.     REVIEW OF SYSTEMS  The remainder of a comprehensive 10 systems review is negative.    PHYSICAL EXAM  There were no vitals filed for this visit.    GENERAL: well developed, well nourished, in no distress  PSYCH: full and appropriate range of affect with good insight and judgement  HEENT: NCAT, pupils equal, sclerae anicteric  EXT: warm and well perfused, no edema  SKIN: No rashes    OBJECTIVE DATA  LABS  Reviewed in emr from last week    RADIOLOGY RESULTS   None today

## 2020-04-11 DIAGNOSIS — R911 Solitary pulmonary nodule: Secondary | ICD-10-CM | POA: Diagnosis not present

## 2020-04-11 DIAGNOSIS — Z923 Personal history of irradiation: Secondary | ICD-10-CM | POA: Diagnosis not present

## 2020-04-11 DIAGNOSIS — Z510 Encounter for antineoplastic radiation therapy: Secondary | ICD-10-CM | POA: Diagnosis not present

## 2020-04-11 DIAGNOSIS — Z87891 Personal history of nicotine dependence: Secondary | ICD-10-CM | POA: Diagnosis not present

## 2020-04-11 DIAGNOSIS — I10 Essential (primary) hypertension: Secondary | ICD-10-CM | POA: Diagnosis not present

## 2020-04-11 DIAGNOSIS — Z9221 Personal history of antineoplastic chemotherapy: Secondary | ICD-10-CM | POA: Diagnosis not present

## 2020-04-11 DIAGNOSIS — Z8572 Personal history of non-Hodgkin lymphomas: Secondary | ICD-10-CM | POA: Diagnosis not present

## 2020-04-11 DIAGNOSIS — C211 Malignant neoplasm of anal canal: Secondary | ICD-10-CM | POA: Diagnosis not present

## 2020-04-14 ENCOUNTER — Ambulatory Visit
Admit: 2020-04-14 | Discharge: 2020-05-13 | Payer: MEDICARE | Attending: Radiation Oncology | Primary: Radiation Oncology

## 2020-04-14 ENCOUNTER — Encounter
Admit: 2020-04-14 | Discharge: 2020-05-13 | Payer: MEDICARE | Attending: Radiation Oncology | Primary: Radiation Oncology

## 2020-04-14 ENCOUNTER — Ambulatory Visit: Admit: 2020-04-14 | Discharge: 2020-05-13 | Payer: MEDICARE

## 2020-04-14 ENCOUNTER — Ambulatory Visit: Admit: 2020-04-14 | Discharge: 2020-04-15 | Payer: MEDICARE

## 2020-04-14 ENCOUNTER — Institutional Professional Consult (permissible substitution)
Admit: 2020-04-14 | Discharge: 2020-05-13 | Payer: MEDICARE | Attending: Radiation Oncology | Primary: Radiation Oncology

## 2020-04-14 ENCOUNTER — Encounter: Admit: 2020-04-14 | Discharge: 2020-05-13 | Payer: MEDICARE

## 2020-04-14 DIAGNOSIS — C210 Malignant neoplasm of anus, unspecified: Principal | ICD-10-CM

## 2020-04-14 DIAGNOSIS — Z923 Personal history of irradiation: Secondary | ICD-10-CM | POA: Diagnosis not present

## 2020-04-14 DIAGNOSIS — Z510 Encounter for antineoplastic radiation therapy: Secondary | ICD-10-CM | POA: Diagnosis not present

## 2020-04-14 DIAGNOSIS — I10 Essential (primary) hypertension: Secondary | ICD-10-CM | POA: Diagnosis not present

## 2020-04-14 DIAGNOSIS — R911 Solitary pulmonary nodule: Secondary | ICD-10-CM | POA: Diagnosis not present

## 2020-04-14 DIAGNOSIS — Z87891 Personal history of nicotine dependence: Secondary | ICD-10-CM | POA: Diagnosis not present

## 2020-04-14 DIAGNOSIS — Z9221 Personal history of antineoplastic chemotherapy: Secondary | ICD-10-CM | POA: Diagnosis not present

## 2020-04-14 DIAGNOSIS — Z5111 Encounter for antineoplastic chemotherapy: Secondary | ICD-10-CM | POA: Diagnosis not present

## 2020-04-14 DIAGNOSIS — C211 Malignant neoplasm of anal canal: Secondary | ICD-10-CM | POA: Diagnosis not present

## 2020-04-14 DIAGNOSIS — Z8572 Personal history of non-Hodgkin lymphomas: Secondary | ICD-10-CM | POA: Diagnosis not present

## 2020-04-14 LAB — COMPREHENSIVE METABOLIC PANEL
ALBUMIN: 3.9 g/dL (ref 3.4–5.0)
ALKALINE PHOSPHATASE: 73 U/L (ref 46–116)
ALT (SGPT): 31 U/L (ref 10–49)
ANION GAP: 5 mmol/L (ref 5–14)
BILIRUBIN TOTAL: 0.4 mg/dL (ref 0.3–1.2)
BLOOD UREA NITROGEN: 15 mg/dL (ref 9–23)
BUN / CREAT RATIO: 14
CALCIUM: 9.6 mg/dL (ref 8.7–10.4)
CHLORIDE: 107 mmol/L (ref 98–107)
CO2: 27.3 mmol/L (ref 20.0–31.0)
CREATININE: 1.11 mg/dL — ABNORMAL HIGH
EGFR CKD-EPI AA MALE: 84 mL/min/{1.73_m2} (ref >=60)
EGFR CKD-EPI NON-AA MALE: 73 mL/min/{1.73_m2} (ref >=60)
GLUCOSE RANDOM: 117 mg/dL (ref 70–179)
POTASSIUM: 3.6 mmol/L (ref 3.4–4.5)
PROTEIN TOTAL: 6.5 g/dL (ref 5.7–8.2)
SODIUM: 139 mmol/L (ref 135–145)

## 2020-04-14 LAB — CBC W/ AUTO DIFF
BASOPHILS ABSOLUTE COUNT: 0 10*9/L (ref 0.0–0.1)
BASOPHILS RELATIVE PERCENT: 0.5 %
EOSINOPHILS ABSOLUTE COUNT: 0.1 10*9/L (ref 0.0–0.7)
EOSINOPHILS RELATIVE PERCENT: 1.2 %
HEMATOCRIT: 33.1 % — ABNORMAL LOW (ref 38.0–50.0)
HEMOGLOBIN: 11.7 g/dL — ABNORMAL LOW (ref 13.5–17.5)
LYMPHOCYTES ABSOLUTE COUNT: 0.3 10*9/L — ABNORMAL LOW (ref 0.7–4.0)
LYMPHOCYTES RELATIVE PERCENT: 8.4 %
MEAN CORPUSCULAR HEMOGLOBIN CONC: 35.2 g/dL (ref 30.0–36.0)
MEAN CORPUSCULAR VOLUME: 93.6 fL (ref 81.0–95.0)
MEAN PLATELET VOLUME: 6.9 fL — ABNORMAL LOW (ref 7.0–10.0)
MONOCYTES ABSOLUTE COUNT: 0.5 10*9/L (ref 0.1–1.0)
MONOCYTES RELATIVE PERCENT: 13.3 %
NEUTROPHILS ABSOLUTE COUNT: 3.1 10*9/L (ref 1.7–7.7)
NEUTROPHILS RELATIVE PERCENT: 76.6 %
NUCLEATED RED BLOOD CELLS: 0 /100{WBCs} (ref <=4)
PLATELET COUNT: 192 10*9/L (ref 150–450)
RED BLOOD CELL COUNT: 3.54 10*12/L — ABNORMAL LOW (ref 4.32–5.72)
WBC ADJUSTED: 4.1 10*9/L (ref 3.5–10.5)

## 2020-04-14 LAB — MEAN PLATELET VOLUME: Platelet mean volume:EntVol:Pt:Bld:Qn:Automated count: 6.9 — ABNORMAL LOW

## 2020-04-14 LAB — POTASSIUM: Potassium:SCnc:Pt:Ser/Plas:Qn:: 3.6

## 2020-04-14 MED ADMIN — sodium chloride (NS) 0.9 % infusion: 100 mL/h | INTRAVENOUS | @ 16:00:00 | Stop: 2020-04-14

## 2020-04-14 MED ADMIN — ondansetron (ZOFRAN) tablet 8 mg: 8 mg | ORAL | @ 16:00:00 | Stop: 2020-04-14

## 2020-04-14 MED ADMIN — mitomycin (MUTAMYCIN) syringe (0.5 mg/mL): 10 mg/m2 | INTRAVENOUS | @ 17:00:00 | Stop: 2020-04-14

## 2020-04-14 MED ADMIN — dexAMETHasone (DECADRON) tablet 8 mg: 8 mg | ORAL | @ 16:00:00 | Stop: 2020-04-14

## 2020-04-14 NOTE — Unmapped (Signed)
04/14/2020    Dose Site Summary:    Subjective/Assessment/Recommendations:    1. Fatigue: energy is low.   2. Pain: using oxycodone 5 mg every 4 hours with tylenol in between for pain Taking Epson salt baths once a day. Encouraged to do this twice a day.   3. Urinary Elimination:  4. Bowel Elimination: denies issues with merilax daily  5. Prescription Needs:  6. Psychosocial:  7. Other:

## 2020-04-14 NOTE — Unmapped (Signed)
Patient arrived to infusion in stable condition for chemotherapy. PIV placed and labs collected as ordered. Labs reviewed and found to be in parameters for treatment. Pre meds given and chemo released to pharmacy. Mitomycin administered as ordered. Patient tolerated well. No adverse reactions noted. PIV flushed and removed per protocol. Patient discharged home to self care, in stable condition. AVS declined.

## 2020-04-15 DIAGNOSIS — Z87891 Personal history of nicotine dependence: Secondary | ICD-10-CM | POA: Diagnosis not present

## 2020-04-15 DIAGNOSIS — C210 Malignant neoplasm of anus, unspecified: Secondary | ICD-10-CM | POA: Diagnosis not present

## 2020-04-15 DIAGNOSIS — Z9221 Personal history of antineoplastic chemotherapy: Secondary | ICD-10-CM | POA: Diagnosis not present

## 2020-04-15 DIAGNOSIS — Z5111 Encounter for antineoplastic chemotherapy: Secondary | ICD-10-CM | POA: Diagnosis not present

## 2020-04-15 DIAGNOSIS — Z510 Encounter for antineoplastic radiation therapy: Secondary | ICD-10-CM | POA: Diagnosis not present

## 2020-04-15 DIAGNOSIS — C211 Malignant neoplasm of anal canal: Secondary | ICD-10-CM | POA: Diagnosis not present

## 2020-04-15 DIAGNOSIS — Z8572 Personal history of non-Hodgkin lymphomas: Secondary | ICD-10-CM | POA: Diagnosis not present

## 2020-04-15 DIAGNOSIS — Z923 Personal history of irradiation: Secondary | ICD-10-CM | POA: Diagnosis not present

## 2020-04-15 DIAGNOSIS — I10 Essential (primary) hypertension: Secondary | ICD-10-CM | POA: Diagnosis not present

## 2020-04-15 DIAGNOSIS — R911 Solitary pulmonary nodule: Secondary | ICD-10-CM | POA: Diagnosis not present

## 2020-04-16 DIAGNOSIS — I10 Essential (primary) hypertension: Secondary | ICD-10-CM | POA: Diagnosis not present

## 2020-04-16 DIAGNOSIS — R911 Solitary pulmonary nodule: Secondary | ICD-10-CM | POA: Diagnosis not present

## 2020-04-16 DIAGNOSIS — Z923 Personal history of irradiation: Secondary | ICD-10-CM | POA: Diagnosis not present

## 2020-04-16 DIAGNOSIS — C211 Malignant neoplasm of anal canal: Secondary | ICD-10-CM | POA: Diagnosis not present

## 2020-04-16 DIAGNOSIS — Z8572 Personal history of non-Hodgkin lymphomas: Secondary | ICD-10-CM | POA: Diagnosis not present

## 2020-04-16 DIAGNOSIS — C210 Malignant neoplasm of anus, unspecified: Secondary | ICD-10-CM | POA: Diagnosis not present

## 2020-04-16 DIAGNOSIS — Z510 Encounter for antineoplastic radiation therapy: Secondary | ICD-10-CM | POA: Diagnosis not present

## 2020-04-16 DIAGNOSIS — Z5111 Encounter for antineoplastic chemotherapy: Secondary | ICD-10-CM | POA: Diagnosis not present

## 2020-04-16 DIAGNOSIS — Z87891 Personal history of nicotine dependence: Secondary | ICD-10-CM | POA: Diagnosis not present

## 2020-04-16 DIAGNOSIS — Z9221 Personal history of antineoplastic chemotherapy: Secondary | ICD-10-CM | POA: Diagnosis not present

## 2020-04-17 DIAGNOSIS — C210 Malignant neoplasm of anus, unspecified: Principal | ICD-10-CM

## 2020-04-17 DIAGNOSIS — Z923 Personal history of irradiation: Secondary | ICD-10-CM | POA: Diagnosis not present

## 2020-04-17 DIAGNOSIS — Z8572 Personal history of non-Hodgkin lymphomas: Secondary | ICD-10-CM | POA: Diagnosis not present

## 2020-04-17 DIAGNOSIS — Z510 Encounter for antineoplastic radiation therapy: Secondary | ICD-10-CM | POA: Diagnosis not present

## 2020-04-17 DIAGNOSIS — Z5111 Encounter for antineoplastic chemotherapy: Secondary | ICD-10-CM | POA: Diagnosis not present

## 2020-04-17 DIAGNOSIS — R911 Solitary pulmonary nodule: Secondary | ICD-10-CM | POA: Diagnosis not present

## 2020-04-17 DIAGNOSIS — Z9221 Personal history of antineoplastic chemotherapy: Secondary | ICD-10-CM | POA: Diagnosis not present

## 2020-04-17 DIAGNOSIS — C211 Malignant neoplasm of anal canal: Secondary | ICD-10-CM | POA: Diagnosis not present

## 2020-04-17 DIAGNOSIS — I10 Essential (primary) hypertension: Secondary | ICD-10-CM | POA: Diagnosis not present

## 2020-04-17 DIAGNOSIS — Z87891 Personal history of nicotine dependence: Secondary | ICD-10-CM | POA: Diagnosis not present

## 2020-04-17 MED ORDER — OXYCODONE 5 MG TABLET
ORAL_TABLET | ORAL | 0 refills | 5.00000 days | Status: CP | PRN
Start: 2020-04-17 — End: 2020-05-17

## 2020-04-17 NOTE — Unmapped (Signed)
04/17/2020    Dose Site Summary:  AV:WUJW+JXB: 04/14/2020: 3,600/3,600 cGy  Rx:BST ANAL: 04/17/2020: 540/1,440 cGy  JYN:WGNFA GTV: 04/17/2020: 4,140 cG  Subjective/Assessment/Recommendations:    1. Fatigue:Slept poorly last night due to pain.   2. Pain: feels raw. Taking oxycodone 5mg  every 4 hours Does not make it through the night without taking meds. Using epson salt soaks.   3. Urinary Elimination: increased frequent urination.   4. Bowel Elimination:has diarhea this morning and will hold merilax for a few days.   5. Prescription Needs:   6. Psychosocial:  7. Other:

## 2020-04-17 NOTE — Unmapped (Signed)
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    ASSESSMENT: 4140cGy of planned 5040cGy  Karnofsky/Lansky Performance Status: 80, Normal activity with effort; some signs or symptoms of disease (ECOG equivalent 1)  Chemotherapy/Systemic therapy:administered concurrently  Clinical Trial:   no    RECOMMENDATIONS:  1. Plan for Therapy: Continue treatment as planned  2. Skin: no issues  3. GI:  Alternating diarrhea/constipation- provided education to help balance bowel regimen  4. GU: Mild frequency/urgency  5. Nausea:  Switching to prn zofran (has compazine as well)- this manages nausea reasonably well  6. Pain:  He does have cancer-related pain and is on narcotic pain medications with escalating needs- will increase to 5-10mg  q4hrs.  He will likely run out before our next visit given increased requirement- refill provided in clinic today after reviewing [place] controlled substances database  He does have a history of narcotic dependence but given the degree of his pain, pain medications are appropriate.    7. Nutrition:  Weight stable  8. Follow-up:  I will see for status check next week and will also coordinate a nurse visit earlier in the week to make sure he is doing OK.    SUBJECTIVE:  Feels very fatigued today with more pain issues.  Pain is located in perianal skin- no pain in inguinal region but does have some itching that can be bothersome.  Taking oxycodone 5mg  q4hrs to help with pain- this helps some but pain has gotten worse over the past week.  Spends most of the day laying on his bed due to discomfort.  Eating OK- weight stable over the past 3 weeks.      PHYSICAL EXAM:  Vital Signs for this encounter:  Wt 78.2 kg (172 lb 8 oz)  - BMI 22.76 kg/m??   Last weight:    Wt Readings from Last 4 Encounters:   04/17/20 78.2 kg (172 lb 8 oz)   04/14/20 79.6 kg (175 lb 6.4 oz)   04/10/20 78.5 kg (173 lb 1.6 oz)   04/02/20 77.3 kg (170 lb 6.4 oz)     General:  Alert and Orientated X 3.  No acute distress.    Skin: Increased perianal erythema with diffuse skin breakdown    I have reviewed the patient's dose delivery, dosimetry, lab tests, patient treatment set-up, port films, treatment parameters and x-rays.    Rayetta Humphrey, MD  Assistant Professor  [facility] Dept of Radiation Oncology  04/17/2020

## 2020-04-18 DIAGNOSIS — Z9221 Personal history of antineoplastic chemotherapy: Secondary | ICD-10-CM | POA: Diagnosis not present

## 2020-04-18 DIAGNOSIS — Z5111 Encounter for antineoplastic chemotherapy: Secondary | ICD-10-CM | POA: Diagnosis not present

## 2020-04-18 DIAGNOSIS — C210 Malignant neoplasm of anus, unspecified: Secondary | ICD-10-CM | POA: Diagnosis not present

## 2020-04-18 DIAGNOSIS — R911 Solitary pulmonary nodule: Secondary | ICD-10-CM | POA: Diagnosis not present

## 2020-04-18 DIAGNOSIS — I10 Essential (primary) hypertension: Secondary | ICD-10-CM | POA: Diagnosis not present

## 2020-04-18 DIAGNOSIS — Z923 Personal history of irradiation: Secondary | ICD-10-CM | POA: Diagnosis not present

## 2020-04-18 DIAGNOSIS — Z510 Encounter for antineoplastic radiation therapy: Secondary | ICD-10-CM | POA: Diagnosis not present

## 2020-04-18 DIAGNOSIS — Z87891 Personal history of nicotine dependence: Secondary | ICD-10-CM | POA: Diagnosis not present

## 2020-04-18 DIAGNOSIS — C211 Malignant neoplasm of anal canal: Secondary | ICD-10-CM | POA: Diagnosis not present

## 2020-04-18 DIAGNOSIS — Z8572 Personal history of non-Hodgkin lymphomas: Secondary | ICD-10-CM | POA: Diagnosis not present

## 2020-04-21 DIAGNOSIS — Z87891 Personal history of nicotine dependence: Secondary | ICD-10-CM | POA: Diagnosis not present

## 2020-04-21 DIAGNOSIS — Z9221 Personal history of antineoplastic chemotherapy: Secondary | ICD-10-CM | POA: Diagnosis not present

## 2020-04-21 DIAGNOSIS — Z8572 Personal history of non-Hodgkin lymphomas: Secondary | ICD-10-CM | POA: Diagnosis not present

## 2020-04-21 DIAGNOSIS — C210 Malignant neoplasm of anus, unspecified: Secondary | ICD-10-CM | POA: Diagnosis not present

## 2020-04-21 DIAGNOSIS — I10 Essential (primary) hypertension: Secondary | ICD-10-CM | POA: Diagnosis not present

## 2020-04-21 DIAGNOSIS — Z5111 Encounter for antineoplastic chemotherapy: Secondary | ICD-10-CM | POA: Diagnosis not present

## 2020-04-21 DIAGNOSIS — R911 Solitary pulmonary nodule: Secondary | ICD-10-CM | POA: Diagnosis not present

## 2020-04-21 DIAGNOSIS — Z510 Encounter for antineoplastic radiation therapy: Secondary | ICD-10-CM | POA: Diagnosis not present

## 2020-04-21 DIAGNOSIS — Z923 Personal history of irradiation: Secondary | ICD-10-CM | POA: Diagnosis not present

## 2020-04-21 DIAGNOSIS — C211 Malignant neoplasm of anal canal: Secondary | ICD-10-CM | POA: Diagnosis not present

## 2020-04-21 NOTE — Unmapped (Signed)
Pt states his pain is managed with oxycodone every 4 hours 5mg  alternating with 10mg .He is using epson salt baths that given him a lot of reief.

## 2020-04-21 NOTE — Unmapped (Signed)
[facility]  Psychiatry   Established Patient E&M Service - Outpatient       Assessment:    Damon Mills presents for follow-up evaluation. Today, patient expresses stability and hopefulness as he reaches the end of treatment for anal cancer. Not acute safety concerns. Patient expresses significant anxiety regarding changes to Xanax. Will plan to continue discussing taper when medically stressors are less acute.     Identifying Information:  Damon Mills is a 59 y.o. male with a history of HIV AIDS, lymphoma s/p radiation in 2006, MDD, GAD, and benzodiazepine dependence who presents for follow up. He has remained overall stable on Remeron and Xanax, with the addition of Seroquel to augment mood, sleep, and poor appetite symptoms in June 2014. He has been on benzodiazepines for approximately 30 years and failed a slow taper d/t increase in anxiety and irritability per partner. Patient with recent diagnosis of anal cancer and is at the end of a course of treatment for anal cancer.      Risk Assessment:  A full suicide and violence risk assessment was performed as part of this patient's initial evaluation with [facility] outpatient psychiatry.  There is no new acute risk for suicide or violence at this time. The patient was educated about relevant modifiable risk factors including following recommendations for treatment of psychiatric illness. While future psychiatric events cannot be accurately predicted, the patient does not currently require acute inpatient psychiatric care and does not currently meet [facility] involuntary commitment criteria.      Plan:    Problem 1: Major Depressive Disorder, Recurrent  Status of problem: improved or improving  Interventions:  - Continue Remeron 60 mg qHS  - Continue Seroquel 200 mg qHS. Will attempt to coordinate repeat lipid labs with patient's other lab draws.       Problem 2: Generalized Anxiety Disorder  Status of problem: chronic and stable  Interventions:  - Continue Xanax 1 mg QID. Long-term risks on cognition discussed in the past. Will plan to taper after further discussion.    Problem 3: HIV AIDS  Status of problem:  chronic and stable  Interventions:  - Continue Biktarvy per [facility] ID    Problem 4: Chronic pain - h/o Opioid Dependence  Status of problem:  chronic and stable   Interventions:   - Continue to monitor  - Continue Tylenol prn    Psychotherapy provided:  No billable psychotherapy service provided.    Patient has been given this writer's contact information as well as the [facility] Psychiatry urgent line number. The patient has been instructed to call 911 for emergencies.    Patient was seen and plan of care was discussed with the Attending MD,Dr. Fredrich Birks, who agrees with the above statement and plan.    Subjective:    Chief complaint:  Follow-up psychiatric evaluation for depression and anxiety.    Interval History:    He notes he has had a lot going on as he was going through cancer treatment. This started back in July. He will finish up radiation this Thursday, and will also finish up chemotherapy. He said his doctor told him the next several weeks are probably going to the be hardest. Sleep has been okay. Energy level has been low. This have gotten progressively worse. He has been informed that effects will improve after he finished up his treatments. Notes this is another bump in the road for him. He note he has had anxiety around his treatment and has  current anxiety regarding the COVID situation. He notes he does not go out often due to this anxiety around COVID. He says he has been feeling down a little bit, but he says other than thinking about cancer he is not feeling too bad. He says his appetite has been pretty good and he has been able to maintain his weight.     His says he has good days and bad days. He notes he is feeling hopeful about his prognosis. He says he does not believe in hurting him self or 'something' and he says that is a cowards way out. He says he has things he is able to do around the house, but this has been limited due to the radiation and stuff. He has no interest in changing his medicines. Changes to Xanax causes him anxiety. He says considering what he is going through, his anxiety is in a stable place.     Denies any alcohol, drug, or tobacco use.           Objective:    Mental Status Exam:  Appearance:    Unable to assess over phone   Motor:   Unable to assess over phone   Speech/Language:    Normal rate, volume, tone, fluency   Mood:   A lot going on    Affect:   Unable to assess over phone   Thought process and Associations:   Logical, linear, clear, coherent, goal directed   Abnormal/psychotic thought content:     Denies SI   Perceptual disturbances:     No concern based on conversation for repsonse to internal stimuli      Other:   n/a     Lipid Panel:   Lab Results   Component Value Date    Cholesterol 144 03/28/2018    LDL Calculated 84 03/28/2018    HDL 30 (L) 03/28/2018    Triglycerides 152 (H) 03/28/2018     Hemoglobin A1C:   Lab Results   Component Value Date    Hemoglobin A1C 5.1 03/28/2018      Visit was completed by video (or phone) and the appropriate disclaimer has been included below.        I spent approximately 20 minutes on the phone with the patient on the date of service. I spent approximately an additional 20 minutes on pre- and post-visit activities on the date of service.     The patient was physically located in [place] or a state in which I am permitted to provide care. The patient and/or parent/guardian understood that s/he may incur co-pays and cost sharing, and agreed to the telemedicine visit. The visit was reasonable and appropriate under the circumstances given the patient's presentation at the time.    The patient and/or parent/guardian has been advised of the potential risks and limitations of this mode of treatment (including, but not limited to, the absence of in-person examination) and has agreed to be treated using telemedicine. The patient's/patient's family's questions regarding telemedicine have been answered.     If the visit was completed in an ambulatory setting, the patient and/or parent/guardian has also been advised to contact their provider???s office for worsening conditions, and seek emergency medical treatment and/or call 911 if the patient deems either necessaary.    Spero Geralds DO   04/23/2020

## 2020-04-22 ENCOUNTER — Telehealth: Admit: 2020-04-22 | Discharge: 2020-04-23 | Payer: MEDICARE

## 2020-04-22 DIAGNOSIS — Z923 Personal history of irradiation: Secondary | ICD-10-CM | POA: Diagnosis not present

## 2020-04-22 DIAGNOSIS — Z5111 Encounter for antineoplastic chemotherapy: Secondary | ICD-10-CM | POA: Diagnosis not present

## 2020-04-22 DIAGNOSIS — Z9221 Personal history of antineoplastic chemotherapy: Secondary | ICD-10-CM | POA: Diagnosis not present

## 2020-04-22 DIAGNOSIS — Z510 Encounter for antineoplastic radiation therapy: Secondary | ICD-10-CM | POA: Diagnosis not present

## 2020-04-22 DIAGNOSIS — Z8572 Personal history of non-Hodgkin lymphomas: Secondary | ICD-10-CM | POA: Diagnosis not present

## 2020-04-22 DIAGNOSIS — Z87891 Personal history of nicotine dependence: Secondary | ICD-10-CM | POA: Diagnosis not present

## 2020-04-22 DIAGNOSIS — C211 Malignant neoplasm of anal canal: Secondary | ICD-10-CM | POA: Diagnosis not present

## 2020-04-22 DIAGNOSIS — R911 Solitary pulmonary nodule: Secondary | ICD-10-CM | POA: Diagnosis not present

## 2020-04-22 DIAGNOSIS — I10 Essential (primary) hypertension: Secondary | ICD-10-CM | POA: Diagnosis not present

## 2020-04-22 DIAGNOSIS — C210 Malignant neoplasm of anus, unspecified: Secondary | ICD-10-CM | POA: Diagnosis not present

## 2020-04-22 MED ORDER — QUETIAPINE 200 MG TABLET
ORAL_TABLET | Freq: Every evening | ORAL | 1 refills | 60.00000 days | Status: CP
Start: 2020-04-22 — End: ?

## 2020-04-22 MED ORDER — ALPRAZOLAM 1 MG TABLET
ORAL_TABLET | Freq: Four times a day (QID) | ORAL | 3 refills | 30 days | Status: CP
Start: 2020-04-22 — End: ?

## 2020-04-22 MED ORDER — MIRTAZAPINE 30 MG TABLET
ORAL_TABLET | Freq: Every evening | ORAL | 1 refills | 60.00000 days | Status: CP
Start: 2020-04-22 — End: ?

## 2020-04-22 NOTE — Unmapped (Signed)
Follow-up instructions:  -- Please continue taking your medications as prescribed for your mental health.   -- Do not make changes to your medications, including taking more or less than prescribed, unless under the supervision of your physician. Be aware that some medications may make you feel worse if abruptly stopped  -- Please refrain from using illicit substances, as these can affect your mood and could cause anxiety or other concerning symptoms.   -- Seek further medical care for any increase in symptoms or new symptoms such as thoughts of wanting to hurt yourself or hurt others.     Contact info:  Life-threatening emergencies: call 911 or go to the nearest ER for medical or psychiatric attention.     Issues that need urgent attention but are not life threatening: call the clinic outpatient frontdesk at [phone] for assistance.     Non-urgent routine concerns, questions, and refill requests: please leave me a voicemail at [phone] and I will get back to you within 2 business days.     Regarding appointments:  - If you need to cancel your appointment, we ask that you call [phone] at least 24 hours before your scheduled appointment.  - If for any reason you arrive 15 minutes later than your scheduled appointment time, you may not be seen and your visit may be rescheduled.  - Please remember that we will not automatically reschedule missed appointments.  - If you miss two (2) appointments without letting us know in advance, you will likely be referred to a provider in your community.  - We will do our best to be on time. Sometimes an emergency will arise that might cause your clinician to be late. We will try to inform you of this when you check in for your appointment. If you wait more than 15 minutes past your appointment time without such notice, please speak with the front desk staff.    In the event of bad weather, the clinic staff will attempt to contact you, should your appointment need to be rescheduled. Additionally, you can call the Patient Weather Line [phone] for system-wide clinic status    For more information and reminders regarding clinic policies (these were provided when you were admitted to the clinic), please ask the front desk.

## 2020-04-23 DIAGNOSIS — C210 Malignant neoplasm of anus, unspecified: Principal | ICD-10-CM

## 2020-04-23 DIAGNOSIS — Z8572 Personal history of non-Hodgkin lymphomas: Secondary | ICD-10-CM | POA: Diagnosis not present

## 2020-04-23 DIAGNOSIS — Z9221 Personal history of antineoplastic chemotherapy: Secondary | ICD-10-CM | POA: Diagnosis not present

## 2020-04-23 DIAGNOSIS — Z87891 Personal history of nicotine dependence: Secondary | ICD-10-CM | POA: Diagnosis not present

## 2020-04-23 DIAGNOSIS — R911 Solitary pulmonary nodule: Secondary | ICD-10-CM | POA: Diagnosis not present

## 2020-04-23 DIAGNOSIS — Z923 Personal history of irradiation: Secondary | ICD-10-CM | POA: Diagnosis not present

## 2020-04-23 DIAGNOSIS — C211 Malignant neoplasm of anal canal: Secondary | ICD-10-CM | POA: Diagnosis not present

## 2020-04-23 DIAGNOSIS — I10 Essential (primary) hypertension: Secondary | ICD-10-CM | POA: Diagnosis not present

## 2020-04-23 DIAGNOSIS — Z5111 Encounter for antineoplastic chemotherapy: Secondary | ICD-10-CM | POA: Diagnosis not present

## 2020-04-23 DIAGNOSIS — Z510 Encounter for antineoplastic radiation therapy: Secondary | ICD-10-CM | POA: Diagnosis not present

## 2020-04-23 MED ORDER — OXYCODONE 5 MG TABLET
ORAL_TABLET | ORAL | 0 refills | 5.00000 days | Status: CP | PRN
Start: 2020-04-23 — End: 2020-05-23

## 2020-04-23 MED ORDER — SILVER SULFADIAZINE 1 % TOPICAL CREAM
1 refills | 0 days | Status: CP
Start: 2020-04-23 — End: 2021-04-23

## 2020-04-23 NOTE — Unmapped (Signed)
04/23/2020    Dose Site Summary:  ZO:XWRU+EAV: 04/14/2020: 3,600/3,600 cGy  Rx:BST ANAL: 04/23/2020: 1,260/1,440 cGy  WUJ:WJXBJ GTV: 04/23/2020: 4,860 cGy  Subjective/Assessment/Recommendations:  epson  Salt baths relieve pain the most.   1. Fatigue: energy is very low.   2. Pain:  3. Urinary Elimination:  4. Bowel Elimination: denies diarrhea using merliax.  5. Prescription Needs:  6. Psychosocial:  7. Other:

## 2020-04-23 NOTE — Unmapped (Signed)
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    ASSESSMENT: 4960cGy of planned 5040cGy  Karnofsky/Lansky Performance Status: 80, Normal activity with effort; some signs or symptoms of disease (ECOG equivalent 1)  Chemotherapy/Systemic therapy:administered concurrently  Clinical Trial:   no    RECOMMENDATIONS:  1. Plan for Therapy: Continue treatment as planned  2. Skin: More issues this week.  Discussed using aquaphor or similar for perianal area.  Gave silvadene for inguinal skin breakdown.  3. GI:  Alternating diarrhea/constipation- provided education to help balance bowel regimen  4. GU: Stable- Mild frequency/urgency  5. Nausea:  continue prn zofran (has compazine as well)- this manages nausea reasonably well  6. Pain:  He does have cancer-related pain and is on narcotic pain medications with escalating needs- will increase to 5-10mg  q4hrs.  He will likely run out before our next visit given increased requirement- refill provided in clinic today after reviewing [place] controlled substances database  He does have a history of narcotic dependence but given the degree of his pain, pain medications are appropriate.    7. Nutrition:  Weight stable  8. Follow-up:  Finishes treatment tomorrow.  I will see back in 1 week for skin check.    SUBJECTIVE:  Doing OK today- more skin irritation and more discomfort than last week.  Also starting to get some focal breakdown in the inguinal areas.  He is taking alternating oxycodone 5mg  with 10mg  q4hrs which makes the pain tolerable.  Not using any lotion on the perianal area.  Spending lots of time in his bathtub which also helps.  Still alternating diarrhea/constipation- started taking miralax this week.    PHYSICAL EXAM:  Vital Signs for this encounter:  There were no vitals taken for this visit.  Last weight:    Wt Readings from Last 4 Encounters:   04/17/20 78.2 kg (172 lb 8 oz)   04/14/20 79.6 kg (175 lb 6.4 oz)   04/10/20 78.5 kg (173 lb 1.6 oz)   04/02/20 77.3 kg (170 lb 6.4 oz)     General:  Alert and Orientated X 3.  No acute distress.    Skin: more perianal erythema with diffuse skin breakdown    I have reviewed the patient's dose delivery, dosimetry, lab tests, patient treatment set-up, port films, treatment parameters and x-rays.    Rayetta Humphrey, MD  Assistant Professor  [facility] Dept of Radiation Oncology  04/23/2020

## 2020-04-24 ENCOUNTER — Ambulatory Visit: Admit: 2020-04-24 | Discharge: 2020-04-25 | Payer: MEDICARE

## 2020-04-24 DIAGNOSIS — C8599 Non-Hodgkin lymphoma, unspecified, extranodal and solid organ sites: Secondary | ICD-10-CM | POA: Diagnosis not present

## 2020-04-24 DIAGNOSIS — Z5111 Encounter for antineoplastic chemotherapy: Secondary | ICD-10-CM | POA: Diagnosis not present

## 2020-04-24 DIAGNOSIS — I10 Essential (primary) hypertension: Secondary | ICD-10-CM | POA: Diagnosis not present

## 2020-04-24 DIAGNOSIS — R911 Solitary pulmonary nodule: Secondary | ICD-10-CM | POA: Diagnosis not present

## 2020-04-24 DIAGNOSIS — T451X5A Adverse effect of antineoplastic and immunosuppressive drugs, initial encounter: Secondary | ICD-10-CM | POA: Diagnosis not present

## 2020-04-24 DIAGNOSIS — Z923 Personal history of irradiation: Secondary | ICD-10-CM | POA: Diagnosis not present

## 2020-04-24 DIAGNOSIS — C210 Malignant neoplasm of anus, unspecified: Secondary | ICD-10-CM | POA: Diagnosis not present

## 2020-04-24 DIAGNOSIS — Z510 Encounter for antineoplastic radiation therapy: Secondary | ICD-10-CM | POA: Diagnosis not present

## 2020-04-24 DIAGNOSIS — Z87891 Personal history of nicotine dependence: Secondary | ICD-10-CM | POA: Diagnosis not present

## 2020-04-24 DIAGNOSIS — Z9221 Personal history of antineoplastic chemotherapy: Secondary | ICD-10-CM | POA: Diagnosis not present

## 2020-04-24 DIAGNOSIS — R110 Nausea: Secondary | ICD-10-CM | POA: Diagnosis not present

## 2020-04-24 DIAGNOSIS — Z8572 Personal history of non-Hodgkin lymphomas: Secondary | ICD-10-CM | POA: Diagnosis not present

## 2020-04-24 DIAGNOSIS — C211 Malignant neoplasm of anal canal: Secondary | ICD-10-CM | POA: Diagnosis not present

## 2020-04-24 NOTE — Unmapped (Signed)
[facility] GI MEDICAL ONCOLOGY     REFERRING PROVIDER:   Sharyn Lull, Md  [address]  [place],  [place] [address]    PRIMARY CARE PROVIDER:  DAVID Ezra Sites, MD [ID]  [address] [facility] ??? INTERNAL MEDICINE  [place] [address]    CONSULTING PROVIDERS  Dr. Rayetta Humphrey, [facility] Radiation Oncology  ____________________________________________________________________    CANCER HISTORY  Diagnosis stage II SCC of anus  1. 02/2020 mod diff squamous cell carcinoma, 3.6 cm.   2. 03/18/2020 Mitomycin/Capecitabine  ____________________________________________________________________    ASSESSMENT  1. cT2N0M0 anus/anal canal SCC  2. Indeterminate pulmonary nodule, likely benign given prior history. PET pending  3. Well controlled HIV  4. MDD/GAD, managed but anxiety in particular remains active  5. History of lymphoma and prior CHOP  6. ECOG PS 0  7. Chemo induced nausea      RECOMMENDATIONS  1. Completed Curative intent chemoradiotherapy with 5FU (capecitabine) + mitomycin.     -continue capecitabine 1500mg  BID on radiation days  2. zofran prn, Compazine prn  3. RTC 6 months for surveillance visit    DISCUSSION  Damon Mills has completed chemo rads.  We reviewed surveillance and we will see him back in 6 months.  He will continue follow-up with radiation oncology as well.  ______________________________________________________________________  HISTORY     Damon Mills is seen today at the [facility] Medical Oncology [facility] for ongoing management regarding squamous cell carcinoma of anus/anal canal.  He completed chemo rads today.  He feels he did okay with his last mitomycin infusion.  He is fatigued but managing okay.  Nausea is well managed with Zofran and Compazine.  He feels like he is eating okay and maintaining his weight.  Skin irritation and rectal discomfort is manageable at this point.  He continues using sitz bath's.  No significant diarrhea issues he is moving his bowels okay. Denies mucositis.  Denies hand/foot syndrome.  He knows this evening will be his last dose of Xeloda.    MEDICAL HISTORY  1. HIV, diagnosed 51. 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 Major depressive disorder; generalized anxiety disorder   3. Lymphoma, RCHOP + involved field RT, 2006    Allergies   Allergen Reactions   ??? Hivid [Zalcitabine] Anaphylaxis, Rash and Nausea And Vomiting     Patient unsure of this med.  Patient unsure of this med.   ??? Ziagen [Abacavir] Anaphylaxis, Rash and Nausea And Vomiting   ??? Cymbalta [Duloxetine] Other (See Comments) and Nausea And Vomiting     Possible seizure like event   ??? Duloxetine Hcl Nausea And Vomiting     Possible seizure like event     Medications reviewed in the EMR    FAMILY HISTORY  No cancer    SOCIAL HISTORY  LOng-term partner, Damon Mills. He does not work outside the home, very active in his garden, moving yard, maintaining home. Used to be very active in Education officer, environmental, did a lot of teaching to youth. Stopped with his lymphoma dx. Stopped smoking in 2007 as well. Rare EtOH. No drugs.     REVIEW OF SYSTEMS  The remainder of a comprehensive 10 systems review is negative.    PHYSICAL EXAM  There were no vitals filed for this visit.    GENERAL: well developed, well nourished, in no distress  PSYCH: full and appropriate range of affect with good insight and judgement  HEENT:  NCAT, pupils equal, sclerae anicteric  EXT: warm and well perfused, no edema  SKIN: No rashes    OBJECTIVE DATA  LABS  Reviewed in emr from last week    RADIOLOGY RESULTS   None today

## 2020-05-01 DIAGNOSIS — C210 Malignant neoplasm of anus, unspecified: Principal | ICD-10-CM

## 2020-05-01 DIAGNOSIS — Z510 Encounter for antineoplastic radiation therapy: Secondary | ICD-10-CM | POA: Diagnosis not present

## 2020-05-01 DIAGNOSIS — Z923 Personal history of irradiation: Secondary | ICD-10-CM | POA: Diagnosis not present

## 2020-05-01 DIAGNOSIS — R911 Solitary pulmonary nodule: Secondary | ICD-10-CM | POA: Diagnosis not present

## 2020-05-01 DIAGNOSIS — Z5111 Encounter for antineoplastic chemotherapy: Secondary | ICD-10-CM | POA: Diagnosis not present

## 2020-05-01 DIAGNOSIS — I10 Essential (primary) hypertension: Secondary | ICD-10-CM | POA: Diagnosis not present

## 2020-05-01 DIAGNOSIS — Z87891 Personal history of nicotine dependence: Secondary | ICD-10-CM | POA: Diagnosis not present

## 2020-05-01 DIAGNOSIS — Z8572 Personal history of non-Hodgkin lymphomas: Secondary | ICD-10-CM | POA: Diagnosis not present

## 2020-05-01 DIAGNOSIS — C211 Malignant neoplasm of anal canal: Secondary | ICD-10-CM | POA: Diagnosis not present

## 2020-05-01 DIAGNOSIS — Z9221 Personal history of antineoplastic chemotherapy: Secondary | ICD-10-CM | POA: Diagnosis not present

## 2020-05-01 LAB — COMPREHENSIVE METABOLIC PANEL
ALBUMIN: 4 g/dL (ref 3.4–5.0)
ALKALINE PHOSPHATASE: 80 U/L (ref 46–116)
ALT (SGPT): 30 U/L (ref 10–49)
ANION GAP: 6 mmol/L (ref 5–14)
AST (SGOT): 28 U/L (ref <=34)
BILIRUBIN TOTAL: 0.6 mg/dL (ref 0.3–1.2)
BLOOD UREA NITROGEN: 14 mg/dL (ref 9–23)
BUN / CREAT RATIO: 11
CALCIUM: 9.8 mg/dL (ref 8.7–10.4)
CHLORIDE: 105 mmol/L (ref 98–107)
CREATININE: 1.23 mg/dL — ABNORMAL HIGH
EGFR CKD-EPI NON-AA MALE: 64 mL/min/{1.73_m2} (ref >=60)
GLUCOSE RANDOM: 118 mg/dL (ref 70–179)
POTASSIUM: 3.7 mmol/L (ref 3.4–4.5)
PROTEIN TOTAL: 7.1 g/dL (ref 5.7–8.2)
SODIUM: 139 mmol/L (ref 135–145)

## 2020-05-01 LAB — NEUTROPHILS ABSOLUTE COUNT: Neutrophils:NCnc:Pt:Bld:Qn:Automated count: 1 — ABNORMAL LOW

## 2020-05-01 LAB — CBC W/ AUTO DIFF
BASOPHILS ABSOLUTE COUNT: 0 10*9/L (ref 0.0–0.1)
BASOPHILS RELATIVE PERCENT: 0.4 %
EOSINOPHILS ABSOLUTE COUNT: 0 10*9/L (ref 0.0–0.7)
EOSINOPHILS RELATIVE PERCENT: 2 %
HEMATOCRIT: 32.1 % — ABNORMAL LOW (ref 38.0–50.0)
LYMPHOCYTES ABSOLUTE COUNT: 0.3 10*9/L — ABNORMAL LOW (ref 0.7–4.0)
LYMPHOCYTES RELATIVE PERCENT: 15.5 %
MEAN CORPUSCULAR HEMOGLOBIN: 34.1 pg — ABNORMAL HIGH (ref 26.0–34.0)
MEAN CORPUSCULAR VOLUME: 97.6 fL — ABNORMAL HIGH (ref 81.0–95.0)
MEAN PLATELET VOLUME: 8.1 fL (ref 7.0–10.0)
MONOCYTES ABSOLUTE COUNT: 0.6 10*9/L (ref 0.1–1.0)
MONOCYTES RELATIVE PERCENT: 30.3 %
NEUTROPHILS ABSOLUTE COUNT: 1 10*9/L — ABNORMAL LOW (ref 1.7–7.7)
NEUTROPHILS RELATIVE PERCENT: 51.8 %
NUCLEATED RED BLOOD CELLS: 0 /100{WBCs} (ref <=4)
PLATELET COUNT: 70 10*9/L — ABNORMAL LOW (ref 150–450)
RED BLOOD CELL COUNT: 3.29 10*12/L — ABNORMAL LOW (ref 4.32–5.72)
RED CELL DISTRIBUTION WIDTH: 17.9 % — ABNORMAL HIGH (ref 12.0–15.0)
WBC ADJUSTED: 1.8 10*9/L — ABNORMAL LOW (ref 3.5–10.5)

## 2020-05-01 LAB — SODIUM: Sodium:SCnc:Pt:Ser/Plas:Qn:: 139

## 2020-05-01 MED ORDER — OXYCODONE 5 MG TABLET
ORAL_TABLET | ORAL | 0 refills | 5.00000 days | Status: CP | PRN
Start: 2020-05-01 — End: 2020-05-31

## 2020-05-01 NOTE — Unmapped (Signed)
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    Interval since completion of RT: 1 week (04/24/2020)  Treatment received:    Plan:  chemoRT, 2 plans  PTV_36: 180cGy x 20 = 3600cGy  PTV_50.4 boost: 180cGy x 8 = 1440cGy    Total dose 5040cGy    ASSESSMENT AND PLAN:   Damon Mills is a 59 y.o. male with 59yo man with well-controlled HIV and a T2N1 squamous cell carcinoma of the anal canal s/p chemoRT (50.4Gy finished 04/24/2020)    - Fever- afebrile in clinic today, but subjectively he had a low grade fever 2 days ago.  Checked his blood counts today and ANC is 1.  Discussed with the patient that if he develops fevers again that he should seek medical attention  - Skin:  Desquamation similar to last week- continue with current skin care  - Nutrition:  Weight is down <5Lb from last week, but decreased appetite and some signs of dehydration.  Discussed increasing fluid intake and strategies to improve hydration.  No IVF today but can consider if this worsens  - Pain:  Managed with oxycodone 5-10mg .  Continue with this and anticipate weaning when we are a little further out from treatment.  Refill provided today  - GI:  No diarrhea    -------------------------------------------     Subjective/Interval History:   [name] is doing OK since finishing up treatment last week.  He's had very low energy and poor appetite.  Eating several meals a day but small amounts with each one.  Doing OK with fluids.  He thinks he had a low grade fever 2 days ago (101) but nothing since then.  Otherwise doing OK- perianal pain but managed with prn oxycodone.    Oncology History   Anal cancer (CMS-HCC)   02/14/2020 Initial Diagnosis    Anal cancer (CMS-HCC)     02/18/2020 -  Cancer Staged    Staging form: Anus, AJCC 8th Edition  - Clinical stage from 02/18/2020: Stage IIA (cT2, cN0, cM0) - Signed by Roni Bread, MD on 02/18/2020       03/18/2020 -  Chemotherapy    OP GI FLUOROURACIL/MITOMYCIN  mitomycin 10 mg/m2 IV on day 1, fluorouracil 1,000 mg/m2/day x 4 days CIVI infused over 96 hours, every 28 days         PHYSICAL EXAMINATION:  There were no vitals taken for this visit.   General: Well-appearing, no acute distress  HEENT: Normocephalic, moist mucous membranes  CV: Regular rate, warm, well perfused  Respiratory: Breathing comfortably on room air  GI: non-distended  Neuro: Alert and oriented to conversation  Psych: Appropriate insight and affect  MSK: Good muscle tone  Rectal:  + desquamation in perianal area- stable from last week    _____________________  Fuller Song, MD, PGY-5  [phone]  Radiation Oncology  [facility]

## 2020-05-01 NOTE — Unmapped (Signed)
Here to fu with Dr Carles Collet.   Has had normal bms  States he has been taking tylenol for a low grade temp of max 101.2 but has stayed mostly between 99-100 degrees. No fever today.   Wt loss of 7 lbs in 2 weeks.   Taking a lot of water.

## 2020-05-08 DIAGNOSIS — C210 Malignant neoplasm of anus, unspecified: Principal | ICD-10-CM

## 2020-05-08 DIAGNOSIS — Z5111 Encounter for antineoplastic chemotherapy: Secondary | ICD-10-CM | POA: Diagnosis not present

## 2020-05-08 DIAGNOSIS — Z9221 Personal history of antineoplastic chemotherapy: Secondary | ICD-10-CM | POA: Diagnosis not present

## 2020-05-08 DIAGNOSIS — R911 Solitary pulmonary nodule: Secondary | ICD-10-CM | POA: Diagnosis not present

## 2020-05-08 DIAGNOSIS — Z510 Encounter for antineoplastic radiation therapy: Secondary | ICD-10-CM | POA: Diagnosis not present

## 2020-05-08 DIAGNOSIS — C211 Malignant neoplasm of anal canal: Secondary | ICD-10-CM | POA: Diagnosis not present

## 2020-05-08 DIAGNOSIS — Z8572 Personal history of non-Hodgkin lymphomas: Secondary | ICD-10-CM | POA: Diagnosis not present

## 2020-05-08 DIAGNOSIS — Z87891 Personal history of nicotine dependence: Secondary | ICD-10-CM | POA: Diagnosis not present

## 2020-05-08 DIAGNOSIS — I10 Essential (primary) hypertension: Secondary | ICD-10-CM | POA: Diagnosis not present

## 2020-05-08 DIAGNOSIS — Z923 Personal history of irradiation: Secondary | ICD-10-CM | POA: Diagnosis not present

## 2020-05-08 MED ORDER — OXYCODONE 5 MG TABLET
ORAL_TABLET | ORAL | 0 refills | 5.00000 days | Status: CP | PRN
Start: 2020-05-08 — End: 2020-06-07

## 2020-05-18 MED ORDER — MONTELUKAST 10 MG TABLET
Freq: Every evening | ORAL | 0 days
Start: 2020-05-18 — End: ?

## 2020-06-02 DIAGNOSIS — C210 Malignant neoplasm of anus, unspecified: Secondary | ICD-10-CM | POA: Diagnosis not present

## 2020-06-02 DIAGNOSIS — R634 Abnormal weight loss: Secondary | ICD-10-CM | POA: Diagnosis not present

## 2020-06-02 DIAGNOSIS — C8590 Non-Hodgkin lymphoma, unspecified, unspecified site: Secondary | ICD-10-CM | POA: Diagnosis not present

## 2020-06-07 DIAGNOSIS — F3342 Major depressive disorder, recurrent, in full remission: Principal | ICD-10-CM

## 2020-06-07 DIAGNOSIS — F419 Anxiety disorder, unspecified: Principal | ICD-10-CM

## 2020-06-07 MED ORDER — MIRTAZAPINE 30 MG TABLET
ORAL_TABLET | Freq: Every evening | ORAL | 2 refills | 0.00000 days
Start: 2020-06-07 — End: ?

## 2020-06-09 MED ORDER — MIRTAZAPINE 30 MG TABLET
ORAL_TABLET | Freq: Every evening | ORAL | 2 refills | 90.00000 days | Status: CP
Start: 2020-06-09 — End: ?

## 2020-06-10 ENCOUNTER — Encounter
Admit: 2020-06-10 | Discharge: 2020-06-12 | Payer: MEDICARE | Attending: Radiation Oncology | Primary: Radiation Oncology

## 2020-06-10 DIAGNOSIS — C210 Malignant neoplasm of anus, unspecified: Principal | ICD-10-CM

## 2020-06-10 DIAGNOSIS — C218 Malignant neoplasm of overlapping sites of rectum, anus and anal canal: Principal | ICD-10-CM

## 2020-06-10 DIAGNOSIS — Z923 Personal history of irradiation: Secondary | ICD-10-CM | POA: Diagnosis not present

## 2020-06-10 DIAGNOSIS — C211 Malignant neoplasm of anal canal: Secondary | ICD-10-CM | POA: Diagnosis not present

## 2020-06-10 DIAGNOSIS — I10 Essential (primary) hypertension: Secondary | ICD-10-CM | POA: Diagnosis not present

## 2020-06-10 DIAGNOSIS — Z9221 Personal history of antineoplastic chemotherapy: Secondary | ICD-10-CM | POA: Diagnosis not present

## 2020-06-10 DIAGNOSIS — Z8572 Personal history of non-Hodgkin lymphomas: Secondary | ICD-10-CM | POA: Diagnosis not present

## 2020-06-10 DIAGNOSIS — R911 Solitary pulmonary nodule: Secondary | ICD-10-CM | POA: Diagnosis not present

## 2020-06-10 DIAGNOSIS — Z87891 Personal history of nicotine dependence: Secondary | ICD-10-CM | POA: Diagnosis not present

## 2020-06-10 DIAGNOSIS — Z510 Encounter for antineoplastic radiation therapy: Secondary | ICD-10-CM | POA: Diagnosis not present

## 2020-07-09 ENCOUNTER — Ambulatory Visit: Admit: 2020-07-09 | Discharge: 2020-07-10 | Payer: MEDICARE

## 2020-07-09 DIAGNOSIS — C211 Malignant neoplasm of anal canal: Secondary | ICD-10-CM | POA: Diagnosis not present

## 2020-07-09 DIAGNOSIS — Z79899 Other long term (current) drug therapy: Secondary | ICD-10-CM | POA: Diagnosis not present

## 2020-07-09 DIAGNOSIS — R911 Solitary pulmonary nodule: Secondary | ICD-10-CM | POA: Diagnosis not present

## 2020-07-09 DIAGNOSIS — C210 Malignant neoplasm of anus, unspecified: Secondary | ICD-10-CM | POA: Diagnosis not present

## 2020-07-16 ENCOUNTER — Encounter: Admit: 2020-07-16 | Discharge: 2020-07-17 | Payer: MEDICARE

## 2020-07-16 DIAGNOSIS — C210 Malignant neoplasm of anus, unspecified: Principal | ICD-10-CM

## 2020-07-17 ENCOUNTER — Ambulatory Visit
Admit: 2020-07-17 | Discharge: 2020-08-12 | Payer: MEDICARE | Attending: Radiation Oncology | Primary: Radiation Oncology

## 2020-07-17 DIAGNOSIS — F419 Anxiety disorder, unspecified: Principal | ICD-10-CM

## 2020-07-17 DIAGNOSIS — Z87891 Personal history of nicotine dependence: Principal | ICD-10-CM

## 2020-07-17 DIAGNOSIS — I10 Essential (primary) hypertension: Principal | ICD-10-CM

## 2020-07-17 DIAGNOSIS — C210 Malignant neoplasm of anus, unspecified: Principal | ICD-10-CM

## 2020-07-17 DIAGNOSIS — F411 Generalized anxiety disorder: Principal | ICD-10-CM

## 2020-07-17 DIAGNOSIS — Z923 Personal history of irradiation: Principal | ICD-10-CM

## 2020-07-17 DIAGNOSIS — R911 Solitary pulmonary nodule: Principal | ICD-10-CM

## 2020-07-17 DIAGNOSIS — F329 Major depressive disorder, single episode, unspecified: Principal | ICD-10-CM

## 2020-07-17 DIAGNOSIS — Z8572 Personal history of non-Hodgkin lymphomas: Principal | ICD-10-CM

## 2020-07-17 DIAGNOSIS — Z9221 Personal history of antineoplastic chemotherapy: Principal | ICD-10-CM

## 2020-07-17 DIAGNOSIS — B20 Human immunodeficiency virus [HIV] disease: Principal | ICD-10-CM

## 2020-07-17 DIAGNOSIS — Z510 Encounter for antineoplastic radiation therapy: Principal | ICD-10-CM

## 2020-07-17 DIAGNOSIS — C211 Malignant neoplasm of anal canal: Principal | ICD-10-CM

## 2020-07-29 ENCOUNTER — Encounter: Admit: 2020-07-29 | Discharge: 2020-07-30 | Payer: MEDICARE

## 2020-07-29 DIAGNOSIS — F419 Anxiety disorder, unspecified: Principal | ICD-10-CM

## 2020-09-23 ENCOUNTER — Encounter: Admit: 2020-09-23 | Discharge: 2020-09-24 | Payer: MEDICARE | Attending: Psychiatry | Primary: Psychiatry

## 2020-09-23 DIAGNOSIS — F419 Anxiety disorder, unspecified: Principal | ICD-10-CM

## 2020-09-23 DIAGNOSIS — F3342 Major depressive disorder, recurrent, in full remission: Principal | ICD-10-CM

## 2020-10-02 DIAGNOSIS — Z1211 Encounter for screening for malignant neoplasm of colon: Secondary | ICD-10-CM | POA: Diagnosis not present

## 2020-10-02 DIAGNOSIS — Z1212 Encounter for screening for malignant neoplasm of rectum: Secondary | ICD-10-CM | POA: Diagnosis not present

## 2020-10-02 DIAGNOSIS — Z85048 Personal history of other malignant neoplasm of rectum, rectosigmoid junction, and anus: Secondary | ICD-10-CM | POA: Diagnosis not present

## 2020-10-14 ENCOUNTER — Encounter: Admit: 2020-10-14 | Discharge: 2020-10-15 | Payer: MEDICARE

## 2020-10-14 DIAGNOSIS — Z85048 Personal history of other malignant neoplasm of rectum, rectosigmoid junction, and anus: Principal | ICD-10-CM

## 2020-10-21 ENCOUNTER — Encounter: Admit: 2020-10-21 | Discharge: 2020-10-22 | Payer: MEDICARE | Attending: Psychiatry | Primary: Psychiatry

## 2020-10-21 DIAGNOSIS — F3342 Major depressive disorder, recurrent, in full remission: Principal | ICD-10-CM

## 2020-10-21 DIAGNOSIS — F411 Generalized anxiety disorder: Principal | ICD-10-CM

## 2020-10-21 DIAGNOSIS — F419 Anxiety disorder, unspecified: Principal | ICD-10-CM

## 2020-10-21 MED ORDER — MIRTAZAPINE 30 MG TABLET
ORAL_TABLET | Freq: Every evening | ORAL | 1 refills | 90 days | Status: CP
Start: 2020-10-21 — End: 2021-04-19

## 2020-10-21 MED ORDER — ALPRAZOLAM 1 MG TABLET
ORAL_TABLET | Freq: Four times a day (QID) | ORAL | 2 refills | 30 days | Status: CP
Start: 2020-10-21 — End: 2021-01-19

## 2020-10-21 MED ORDER — QUETIAPINE 200 MG TABLET
ORAL_TABLET | Freq: Every evening | ORAL | 1 refills | 90 days | Status: CP
Start: 2020-10-21 — End: 2021-04-19

## 2020-10-30 ENCOUNTER — Encounter: Admit: 2020-10-30 | Discharge: 2020-10-31 | Payer: MEDICARE

## 2020-10-30 DIAGNOSIS — B20 Human immunodeficiency virus [HIV] disease: Principal | ICD-10-CM

## 2020-10-30 DIAGNOSIS — C210 Malignant neoplasm of anus, unspecified: Principal | ICD-10-CM

## 2020-10-30 DIAGNOSIS — Z08 Encounter for follow-up examination after completed treatment for malignant neoplasm: Principal | ICD-10-CM

## 2020-10-30 DIAGNOSIS — Z85048 Personal history of other malignant neoplasm of rectum, rectosigmoid junction, and anus: Principal | ICD-10-CM

## 2020-10-30 DIAGNOSIS — R739 Hyperglycemia, unspecified: Secondary | ICD-10-CM | POA: Diagnosis not present

## 2020-10-30 DIAGNOSIS — R948 Abnormal results of function studies of other organs and systems: Secondary | ICD-10-CM | POA: Diagnosis not present

## 2020-11-03 DIAGNOSIS — C210 Malignant neoplasm of anus, unspecified: Principal | ICD-10-CM

## 2020-12-16 DIAGNOSIS — Z1211 Encounter for screening for malignant neoplasm of colon: Secondary | ICD-10-CM | POA: Diagnosis not present

## 2020-12-16 DIAGNOSIS — C210 Malignant neoplasm of anus, unspecified: Secondary | ICD-10-CM | POA: Diagnosis not present

## 2020-12-31 DIAGNOSIS — I10 Essential (primary) hypertension: Secondary | ICD-10-CM | POA: Diagnosis not present

## 2020-12-31 DIAGNOSIS — Z0000 Encounter for general adult medical examination without abnormal findings: Secondary | ICD-10-CM | POA: Diagnosis not present

## 2020-12-31 DIAGNOSIS — C8590 Non-Hodgkin lymphoma, unspecified, unspecified site: Secondary | ICD-10-CM | POA: Diagnosis not present

## 2021-01-05 DIAGNOSIS — F3342 Major depressive disorder, recurrent, in full remission: Principal | ICD-10-CM

## 2021-01-05 DIAGNOSIS — F419 Anxiety disorder, unspecified: Principal | ICD-10-CM

## 2021-01-05 MED ORDER — MIRTAZAPINE 30 MG TABLET
ORAL_TABLET | Freq: Every evening | ORAL | 2 refills | 90.00000 days | Status: CP
Start: 2021-01-05 — End: 2021-10-02

## 2021-01-06 ENCOUNTER — Encounter: Admit: 2021-01-06 | Discharge: 2021-01-07 | Payer: MEDICARE | Attending: Psychiatry | Primary: Psychiatry

## 2021-01-06 DIAGNOSIS — F3342 Major depressive disorder, recurrent, in full remission: Principal | ICD-10-CM

## 2021-01-06 DIAGNOSIS — F411 Generalized anxiety disorder: Principal | ICD-10-CM

## 2021-01-14 DIAGNOSIS — F419 Anxiety disorder, unspecified: Principal | ICD-10-CM

## 2021-01-14 DIAGNOSIS — F3342 Major depressive disorder, recurrent, in full remission: Principal | ICD-10-CM

## 2021-01-14 MED ORDER — ALPRAZOLAM 1 MG TABLET
ORAL_TABLET | Freq: Four times a day (QID) | ORAL | 2 refills | 30 days | Status: CP
Start: 2021-01-14 — End: 2021-04-14

## 2021-01-27 DIAGNOSIS — Z01818 Encounter for other preprocedural examination: Secondary | ICD-10-CM | POA: Diagnosis not present

## 2021-01-30 DIAGNOSIS — K640 First degree hemorrhoids: Secondary | ICD-10-CM | POA: Diagnosis not present

## 2021-01-30 DIAGNOSIS — Z1211 Encounter for screening for malignant neoplasm of colon: Secondary | ICD-10-CM | POA: Diagnosis not present

## 2021-01-30 DIAGNOSIS — D125 Benign neoplasm of sigmoid colon: Secondary | ICD-10-CM | POA: Diagnosis not present

## 2021-01-30 DIAGNOSIS — K650 Generalized (acute) peritonitis: Secondary | ICD-10-CM | POA: Diagnosis not present

## 2021-01-30 DIAGNOSIS — C210 Malignant neoplasm of anus, unspecified: Secondary | ICD-10-CM | POA: Diagnosis not present

## 2021-01-30 DIAGNOSIS — K635 Polyp of colon: Secondary | ICD-10-CM | POA: Diagnosis not present

## 2021-01-30 DIAGNOSIS — D122 Benign neoplasm of ascending colon: Secondary | ICD-10-CM | POA: Diagnosis not present

## 2021-01-30 DIAGNOSIS — Z85048 Personal history of other malignant neoplasm of rectum, rectosigmoid junction, and anus: Secondary | ICD-10-CM | POA: Diagnosis not present

## 2021-03-03 ENCOUNTER — Encounter: Admit: 2021-03-03 | Discharge: 2021-03-04 | Payer: MEDICARE

## 2021-03-03 DIAGNOSIS — Z85048 Personal history of other malignant neoplasm of rectum, rectosigmoid junction, and anus: Secondary | ICD-10-CM | POA: Diagnosis not present

## 2021-04-07 ENCOUNTER — Encounter
Admit: 2021-04-07 | Discharge: 2021-04-08 | Payer: MEDICARE | Attending: Student in an Organized Health Care Education/Training Program | Primary: Student in an Organized Health Care Education/Training Program

## 2021-04-07 DIAGNOSIS — F411 Generalized anxiety disorder: Principal | ICD-10-CM

## 2021-04-07 DIAGNOSIS — F3342 Major depressive disorder, recurrent, in full remission: Principal | ICD-10-CM

## 2021-04-21 MED ORDER — ALPRAZOLAM 1 MG TABLET
ORAL_TABLET | Freq: Four times a day (QID) | ORAL | 2 refills | 30 days | Status: CP
Start: 2021-04-21 — End: 2021-07-20

## 2021-04-27 DIAGNOSIS — I10 Essential (primary) hypertension: Secondary | ICD-10-CM | POA: Diagnosis not present

## 2021-04-27 DIAGNOSIS — R739 Hyperglycemia, unspecified: Secondary | ICD-10-CM | POA: Diagnosis not present

## 2021-04-27 DIAGNOSIS — C8590 Non-Hodgkin lymphoma, unspecified, unspecified site: Secondary | ICD-10-CM | POA: Diagnosis not present

## 2021-04-27 DIAGNOSIS — D013 Carcinoma in situ of anus and anal canal: Secondary | ICD-10-CM | POA: Diagnosis not present

## 2021-05-04 DIAGNOSIS — C210 Malignant neoplasm of anus, unspecified: Secondary | ICD-10-CM | POA: Diagnosis not present

## 2021-05-04 DIAGNOSIS — I10 Essential (primary) hypertension: Secondary | ICD-10-CM | POA: Diagnosis not present

## 2021-05-04 DIAGNOSIS — C8590 Non-Hodgkin lymphoma, unspecified, unspecified site: Secondary | ICD-10-CM | POA: Diagnosis not present

## 2021-05-04 DIAGNOSIS — R7303 Prediabetes: Secondary | ICD-10-CM | POA: Diagnosis not present

## 2021-05-18 DIAGNOSIS — F3342 Major depressive disorder, recurrent, in full remission: Principal | ICD-10-CM

## 2021-05-18 DIAGNOSIS — F419 Anxiety disorder, unspecified: Principal | ICD-10-CM

## 2021-05-18 MED ORDER — QUETIAPINE 200 MG TABLET
ORAL_TABLET | 1 refills | 0 days
Start: 2021-05-18 — End: ?

## 2021-05-20 MED ORDER — QUETIAPINE 200 MG TABLET
ORAL_TABLET | 1 refills | 0 days | Status: CP
Start: 2021-05-20 — End: ?

## 2021-06-30 ENCOUNTER — Telehealth
Admit: 2021-06-30 | Discharge: 2021-07-01 | Payer: MEDICARE | Attending: Student in an Organized Health Care Education/Training Program | Primary: Student in an Organized Health Care Education/Training Program

## 2021-06-30 DIAGNOSIS — F3342 Major depressive disorder, recurrent, in full remission: Principal | ICD-10-CM

## 2021-06-30 DIAGNOSIS — F419 Anxiety disorder, unspecified: Principal | ICD-10-CM

## 2021-06-30 DIAGNOSIS — F411 Generalized anxiety disorder: Principal | ICD-10-CM

## 2021-06-30 DIAGNOSIS — F1320 Sedative, hypnotic or anxiolytic dependence, uncomplicated: Principal | ICD-10-CM

## 2021-06-30 DIAGNOSIS — Z79899 Other long term (current) drug therapy: Principal | ICD-10-CM

## 2021-07-07 ENCOUNTER — Ambulatory Visit: Admit: 2021-07-07 | Discharge: 2021-07-08 | Payer: MEDICARE

## 2021-07-07 DIAGNOSIS — C210 Malignant neoplasm of anus, unspecified: Secondary | ICD-10-CM | POA: Diagnosis not present

## 2021-07-20 DIAGNOSIS — R739 Hyperglycemia, unspecified: Secondary | ICD-10-CM | POA: Diagnosis not present

## 2021-07-20 DIAGNOSIS — I10 Essential (primary) hypertension: Secondary | ICD-10-CM | POA: Diagnosis not present

## 2021-07-21 MED ORDER — ALPRAZOLAM 1 MG TABLET
ORAL_TABLET | Freq: Four times a day (QID) | ORAL | 2 refills | 30.00000 days | Status: CP
Start: 2021-07-21 — End: 2021-10-19

## 2021-07-22 DIAGNOSIS — C210 Malignant neoplasm of anus, unspecified: Principal | ICD-10-CM

## 2021-07-23 ENCOUNTER — Ambulatory Visit: Admit: 2021-07-23 | Discharge: 2021-07-24 | Payer: MEDICARE

## 2021-07-23 DIAGNOSIS — B20 Human immunodeficiency virus [HIV] disease: Principal | ICD-10-CM

## 2021-07-23 DIAGNOSIS — C210 Malignant neoplasm of anus, unspecified: Principal | ICD-10-CM

## 2021-07-23 DIAGNOSIS — C8599 Non-Hodgkin lymphoma, unspecified, extranodal and solid organ sites: Principal | ICD-10-CM

## 2021-07-23 DIAGNOSIS — Z85048 Personal history of other malignant neoplasm of rectum, rectosigmoid junction, and anus: Principal | ICD-10-CM

## 2021-07-23 DIAGNOSIS — Z08 Encounter for follow-up examination after completed treatment for malignant neoplasm: Principal | ICD-10-CM

## 2021-07-23 DIAGNOSIS — Z87891 Personal history of nicotine dependence: Secondary | ICD-10-CM | POA: Diagnosis not present

## 2021-07-23 DIAGNOSIS — C211 Malignant neoplasm of anal canal: Secondary | ICD-10-CM | POA: Diagnosis not present

## 2021-07-23 DIAGNOSIS — K769 Liver disease, unspecified: Secondary | ICD-10-CM | POA: Diagnosis not present

## 2021-07-23 DIAGNOSIS — R911 Solitary pulmonary nodule: Secondary | ICD-10-CM | POA: Diagnosis not present

## 2021-07-27 DIAGNOSIS — N1831 Chronic kidney disease, stage 3a: Secondary | ICD-10-CM | POA: Diagnosis not present

## 2021-07-27 DIAGNOSIS — C8590 Non-Hodgkin lymphoma, unspecified, unspecified site: Secondary | ICD-10-CM | POA: Diagnosis not present

## 2021-07-27 DIAGNOSIS — Z23 Encounter for immunization: Secondary | ICD-10-CM | POA: Diagnosis not present

## 2021-07-27 DIAGNOSIS — C210 Malignant neoplasm of anus, unspecified: Secondary | ICD-10-CM | POA: Diagnosis not present

## 2021-07-27 DIAGNOSIS — I10 Essential (primary) hypertension: Secondary | ICD-10-CM | POA: Diagnosis not present

## 2021-07-27 DIAGNOSIS — R7303 Prediabetes: Secondary | ICD-10-CM | POA: Diagnosis not present

## 2021-10-01 ENCOUNTER — Ambulatory Visit: Admit: 2021-10-01 | Discharge: 2021-10-01 | Payer: MEDICARE

## 2021-10-01 DIAGNOSIS — C210 Malignant neoplasm of anus, unspecified: Principal | ICD-10-CM

## 2021-10-01 DIAGNOSIS — C8599 Non-Hodgkin lymphoma, unspecified, extranodal and solid organ sites: Principal | ICD-10-CM

## 2021-10-01 DIAGNOSIS — B20 Human immunodeficiency virus [HIV] disease: Principal | ICD-10-CM

## 2021-10-01 DIAGNOSIS — Z87891 Personal history of nicotine dependence: Secondary | ICD-10-CM | POA: Diagnosis not present

## 2021-10-01 DIAGNOSIS — C211 Malignant neoplasm of anal canal: Secondary | ICD-10-CM | POA: Diagnosis not present

## 2021-10-01 DIAGNOSIS — K862 Cyst of pancreas: Secondary | ICD-10-CM | POA: Diagnosis not present

## 2021-10-01 DIAGNOSIS — K769 Liver disease, unspecified: Secondary | ICD-10-CM | POA: Diagnosis not present

## 2021-10-03 DIAGNOSIS — F419 Anxiety disorder, unspecified: Principal | ICD-10-CM

## 2021-10-03 DIAGNOSIS — F3342 Major depressive disorder, recurrent, in full remission: Principal | ICD-10-CM

## 2021-10-03 MED ORDER — MIRTAZAPINE 30 MG TABLET
ORAL_TABLET | Freq: Every evening | ORAL | 2 refills | 90 days
Start: 2021-10-03 — End: 2022-06-30

## 2021-10-05 MED ORDER — MIRTAZAPINE 30 MG TABLET
ORAL_TABLET | Freq: Every evening | ORAL | 0 refills | 90 days | Status: CP
Start: 2021-10-05 — End: 2022-01-03

## 2021-10-12 MED ORDER — QUETIAPINE 200 MG TABLET
ORAL_TABLET | Freq: Every evening | ORAL | 0 refills | 90 days
Start: 2021-10-12 — End: 2022-01-10

## 2021-10-13 ENCOUNTER — Telehealth
Admit: 2021-10-13 | Discharge: 2021-10-14 | Payer: MEDICARE | Attending: Student in an Organized Health Care Education/Training Program | Primary: Student in an Organized Health Care Education/Training Program

## 2021-10-13 DIAGNOSIS — F32A Depression, unspecified depression type: Principal | ICD-10-CM

## 2021-10-13 DIAGNOSIS — F419 Anxiety disorder, unspecified: Principal | ICD-10-CM

## 2021-10-13 MED ORDER — TRAZODONE 50 MG TABLET
ORAL_TABLET | ORAL | 0 refills | 90 days | Status: CP
Start: 2021-10-13 — End: 2022-01-11

## 2021-10-13 MED ORDER — QUETIAPINE 200 MG TABLET
ORAL_TABLET | Freq: Every evening | ORAL | 0 refills | 90 days | Status: CP
Start: 2021-10-13 — End: 2022-01-11

## 2021-10-18 MED ORDER — ALPRAZOLAM 1 MG TABLET
ORAL_TABLET | Freq: Four times a day (QID) | ORAL | 2 refills | 30.00000 days | Status: CP
Start: 2021-10-18 — End: 2022-01-16

## 2021-12-01 ENCOUNTER — Ambulatory Visit: Admit: 2021-12-01 | Discharge: 2021-12-02 | Payer: MEDICARE

## 2021-12-01 DIAGNOSIS — Z85048 Personal history of other malignant neoplasm of rectum, rectosigmoid junction, and anus: Principal | ICD-10-CM

## 2021-12-07 MED ORDER — MIRTAZAPINE 30 MG TABLET
ORAL_TABLET | Freq: Every evening | ORAL | 0 refills | 90 days
Start: 2021-12-07 — End: 2022-03-07

## 2021-12-07 MED ORDER — TRAZODONE 50 MG TABLET
ORAL_TABLET | Freq: Every evening | ORAL | 0 refills | 90 days
Start: 2021-12-07 — End: 2022-03-07

## 2021-12-08 ENCOUNTER — Telehealth
Admit: 2021-12-08 | Discharge: 2021-12-09 | Payer: MEDICARE | Attending: Student in an Organized Health Care Education/Training Program | Primary: Student in an Organized Health Care Education/Training Program

## 2021-12-08 DIAGNOSIS — F419 Anxiety disorder, unspecified: Principal | ICD-10-CM

## 2021-12-08 DIAGNOSIS — F3342 Major depressive disorder, recurrent, in full remission: Principal | ICD-10-CM

## 2021-12-08 MED ORDER — TRAZODONE 50 MG TABLET
ORAL_TABLET | Freq: Every evening | ORAL | 0 refills | 90 days | Status: CN
Start: 2021-12-08 — End: 2022-03-08

## 2021-12-08 MED ORDER — QUETIAPINE 200 MG TABLET
ORAL_TABLET | Freq: Every evening | ORAL | 0 refills | 90 days | Status: CP
Start: 2021-12-08 — End: 2022-03-08

## 2021-12-08 MED ORDER — ALPRAZOLAM 1 MG TABLET
ORAL_TABLET | Freq: Four times a day (QID) | ORAL | 2 refills | 30 days | Status: CP
Start: 2021-12-08 — End: 2022-03-08

## 2021-12-08 MED ORDER — MIRTAZAPINE 30 MG TABLET
ORAL_TABLET | Freq: Every evening | ORAL | 0 refills | 90.00000 days | Status: CP
Start: 2021-12-08 — End: 2022-03-08

## 2022-01-13 MED ORDER — TRAZODONE 50 MG TABLET
ORAL_TABLET | ORAL | 0 refills | 0 days
Start: 2022-01-13 — End: ?

## 2022-01-14 MED ORDER — TRAZODONE 50 MG TABLET
ORAL_TABLET | ORAL | 0 refills | 90 days
Start: 2022-01-14 — End: 2022-04-14

## 2022-01-25 MED ORDER — MIRTAZAPINE 30 MG TABLET
ORAL_TABLET | Freq: Every evening | ORAL | 0 refills | 90 days
Start: 2022-01-25 — End: 2022-04-25

## 2022-01-25 MED ORDER — QUETIAPINE 200 MG TABLET
ORAL_TABLET | Freq: Every evening | ORAL | 0 refills | 90 days
Start: 2022-01-25 — End: 2022-04-25

## 2022-01-26 ENCOUNTER — Telehealth
Admit: 2022-01-26 | Discharge: 2022-01-27 | Payer: MEDICARE | Attending: Student in an Organized Health Care Education/Training Program | Primary: Student in an Organized Health Care Education/Training Program

## 2022-01-26 DIAGNOSIS — F430 Acute stress reaction: Principal | ICD-10-CM

## 2022-01-26 DIAGNOSIS — F419 Anxiety disorder, unspecified: Principal | ICD-10-CM

## 2022-01-26 DIAGNOSIS — F32A Depression, unspecified depression type: Principal | ICD-10-CM

## 2022-02-23 ENCOUNTER — Telehealth
Admit: 2022-02-23 | Discharge: 2022-02-24 | Payer: MEDICARE | Attending: Student in an Organized Health Care Education/Training Program | Primary: Student in an Organized Health Care Education/Training Program

## 2022-02-23 ENCOUNTER — Ambulatory Visit: Admit: 2022-02-23 | Payer: MEDICARE | Attending: Radiation Oncology | Primary: Radiation Oncology

## 2022-02-23 DIAGNOSIS — C210 Malignant neoplasm of anus, unspecified: Principal | ICD-10-CM

## 2022-02-23 DIAGNOSIS — F3342 Major depressive disorder, recurrent, in full remission: Principal | ICD-10-CM

## 2022-02-23 DIAGNOSIS — F32A Depression, unspecified depression type: Principal | ICD-10-CM

## 2022-02-23 DIAGNOSIS — F419 Anxiety disorder, unspecified: Principal | ICD-10-CM

## 2022-02-23 MED ORDER — MIRTAZAPINE 30 MG TABLET
ORAL_TABLET | Freq: Every evening | ORAL | 0 refills | 90 days | Status: CP
Start: 2022-02-23 — End: 2022-05-24

## 2022-02-23 MED ORDER — QUETIAPINE 200 MG TABLET
ORAL_TABLET | Freq: Every evening | ORAL | 0 refills | 90 days | Status: CP
Start: 2022-02-23 — End: 2022-05-24

## 2022-04-06 ENCOUNTER — Telehealth: Admit: 2022-04-06 | Discharge: 2022-04-07 | Payer: MEDICARE

## 2022-04-06 DIAGNOSIS — F1320 Sedative, hypnotic or anxiolytic dependence, uncomplicated: Principal | ICD-10-CM

## 2022-04-06 DIAGNOSIS — F3342 Major depressive disorder, recurrent, in full remission: Principal | ICD-10-CM

## 2022-04-06 DIAGNOSIS — F411 Generalized anxiety disorder: Principal | ICD-10-CM

## 2022-04-12 MED ORDER — ALPRAZOLAM 1 MG TABLET
ORAL_TABLET | Freq: Four times a day (QID) | ORAL | 0 refills | 30 days | Status: CP
Start: 2022-04-12 — End: 2023-04-12

## 2022-04-20 ENCOUNTER — Ambulatory Visit: Admit: 2022-04-20 | Discharge: 2022-04-21 | Payer: MEDICARE

## 2022-04-22 ENCOUNTER — Ambulatory Visit (INDEPENDENT_AMBULATORY_CARE_PROVIDER_SITE_OTHER): Payer: Medicare Other | Admitting: Dermatology

## 2022-04-22 DIAGNOSIS — L578 Other skin changes due to chronic exposure to nonionizing radiation: Secondary | ICD-10-CM | POA: Diagnosis not present

## 2022-04-22 DIAGNOSIS — L820 Inflamed seborrheic keratosis: Secondary | ICD-10-CM

## 2022-04-22 NOTE — Patient Instructions (Addendum)
Cryotherapy Aftercare  Wash gently with soap and water everyday.   Apply Vaseline and Band-Aid daily until healed.     Due to recent changes in healthcare laws, you may see results of your pathology and/or laboratory studies on MyChart before the doctors have had a chance to review them. We understand that in some cases there may be results that are confusing or concerning to you. Please understand that not all results are received at the same time and often the doctors may need to interpret multiple results in order to provide you with the best plan of care or course of treatment. Therefore, we ask that you please give us 2 business days to thoroughly review all your results before contacting the office for clarification. Should we see a critical lab result, you will be contacted sooner.   If You Need Anything After Your Visit  If you have any questions or concerns for your doctor, please call our main line at [phone] and press option 4 to reach your doctor's medical assistant. If no one answers, please leave a voicemail as directed and we will return your call as soon as possible. Messages left after 4 pm will be answered the following business day.   You may also send us a message via MyChart. We typically respond to MyChart messages within 1-2 business days.  For prescription refills, please ask your pharmacy to contact our office. Our fax number is [phone].  If you have an urgent issue when the clinic is closed that cannot wait until the next business day, you can page your doctor at the number below.    Please note that while we do our best to be available for urgent issues outside of office hours, we are not available 24/7.   If you have an urgent issue and are unable to reach us, you may choose to seek medical care at your doctor's office, retail clinic, urgent care center, or emergency room.  If you have a medical emergency, please immediately call 911 or go to the  emergency department.  Pager Numbers  - Dr. Kowalski: [phone]  - Dr. Moye: [phone]  - Dr. Stewart: [phone]  In the event of inclement weather, please call our main line at [phone] for an update on the status of any delays or closures.  Dermatology Medication Tips: Please keep the boxes that topical medications come in in order to help keep track of the instructions about where and how to use these. Pharmacies typically print the medication instructions only on the boxes and not directly on the medication tubes.   If your medication is too expensive, please contact our office at [phone] option 4 or send us a message through MyChart.   We are unable to tell what your co-pay for medications will be in advance as this is different depending on your insurance coverage. However, we may be able to find a substitute medication at lower cost or fill out paperwork to get insurance to cover a needed medication.   If a prior authorization is required to get your medication covered by your insurance company, please allow us 1-2 business days to complete this process.  Drug prices often vary depending on where the prescription is filled and some pharmacies may offer cheaper prices.  The website www.goodrx.com contains coupons for medications through different pharmacies. The prices here do not account for what the cost may be with help from insurance (it may be cheaper with your insurance), but the website can   give you the price if you did not use any insurance.  - You can print the associated coupon and take it with your prescription to the pharmacy.  - You may also stop by our office during regular business hours and pick up a GoodRx coupon card.  - If you need your prescription sent electronically to a different pharmacy, notify our office through [place] MyChart or by phone at [phone] option 4.     Si Usted Necesita Algo Despus de Su Visita  Tambin puede  enviarnos un mensaje a travs de MyChart. Por lo general respondemos a los mensajes de MyChart en el transcurso de 1 a 2 das hbiles.  Para renovar recetas, por favor pida a su farmacia que se ponga en contacto con nuestra oficina. Nuestro nmero de fax es el [phone].  Si tiene un asunto urgente cuando la clnica est cerrada y que no puede esperar hasta el siguiente da hbil, puede llamar/localizar a su doctor(a) al nmero que aparece a continuacin.   Por favor, tenga en cuenta que aunque hacemos todo lo posible para estar disponibles para asuntos urgentes fuera del horario de oficina, no estamos disponibles las 24 horas del da, los 7 das de la semana.   Si tiene un problema urgente y no puede comunicarse con nosotros, puede optar por buscar atencin mdica  en el consultorio de su doctor(a), en una clnica privada, en un centro de atencin urgente o en una sala de emergencias.  Si tiene una emergencia mdica, por favor llame inmediatamente al 911 o vaya a la sala de emergencias.  Nmeros de bper  - Dr. Kowalski: [phone]  - Dra. Moye: [phone]  - Dra. Stewart: [phone]  En caso de inclemencias del tiempo, por favor llame a nuestra lnea principal al [phone] para una actualizacin sobre el estado de cualquier retraso o cierre.  Consejos para la medicacin en dermatologa: Por favor, guarde las cajas en las que vienen los medicamentos de uso tpico para ayudarle a seguir las instrucciones sobre dnde y cmo usarlos. Las farmacias generalmente imprimen las instrucciones del medicamento slo en las cajas y no directamente en los tubos del medicamento.   Si su medicamento es muy caro, por favor, pngase en contacto con nuestra oficina llamando al [phone] y presione la opcin 4 o envenos un mensaje a travs de MyChart.   No podemos decirle cul ser su copago por los medicamentos por adelantado ya que esto es diferente dependiendo de la cobertura de su seguro.  Sin embargo, es posible que podamos encontrar un medicamento sustituto a menor costo o llenar un formulario para que el seguro cubra el medicamento que se considera necesario.   Si se requiere una autorizacin previa para que su compaa de seguros cubra su medicamento, por favor permtanos de 1 a 2 das hbiles para completar este proceso.  Los precios de los medicamentos varan con frecuencia dependiendo del lugar de dnde se surte la receta y alguna farmacias pueden ofrecer precios ms baratos.  El sitio web www.goodrx.com tiene cupones para medicamentos de diferentes farmacias. Los precios aqu no tienen en cuenta lo que podra costar con la ayuda del seguro (puede ser ms barato con su seguro), pero el sitio web puede darle el precio si no utiliz ningn seguro.  - Puede imprimir el cupn correspondiente y llevarlo con su receta a la farmacia.  - Tambin puede pasar por nuestra oficina durante el horario de atencin regular y recoger una tarjeta de cupones de GoodRx.  -   Si necesita que su receta se enve electrnicamente a una farmacia diferente, informe a nuestra oficina a travs de MyChart de [place] o por telfono llamando al [phone] y presione la opcin 4.  

## 2022-04-22 NOTE — Progress Notes (Signed)
   New Patient Visit  Subjective  Steven Compton is a 61 y.o. male who presents for the following: Skin Problem (Check a growth on the nose x 3 months that will not go away ). The patient has spots, moles and lesions to be evaluated, some may be new or changing and the patient has concerns that these could be cancer.  The following portions of the chart were reviewed this encounter and updated as appropriate:   Tobacco  Allergies  Meds  Problems  Med Hx  Surg Hx  Fam Hx     Review of Systems:  No other skin or systemic complaints except as noted in HPI or Assessment and Plan.  Objective  Well appearing patient in no apparent distress; mood and affect are within normal limits.  A focused examination was performed including face,nose. Relevant physical exam findings are noted in the Assessment and Plan.  Nose 1.4 x 1.0 cm crusted Stuck-on, waxy, tan-brown papule -- Discussed benign etiology and prognosis.         Assessment & Plan  Inflamed seborrheic keratosis Nose  Symptomatic, irritating, patient would like treated.   Destruction of lesion - Nose Complexity: simple   Destruction method: cryotherapy   Informed consent: discussed and consent obtained   Timeout:  patient name, date of birth, surgical site, and procedure verified Lesion destroyed using liquid nitrogen: Yes   Region frozen until ice ball extended beyond lesion: Yes   Outcome: patient tolerated procedure well with no complications   Post-procedure details: wound care instructions given    Actinic Damage - chronic, secondary to cumulative UV radiation exposure/sun exposure over time - diffuse scaly erythematous macules with underlying dyspigmentation - Recommend daily broad spectrum sunscreen SPF 30+ to sun-exposed areas, reapply every 2 hours as needed.  - Recommend staying in the shade or wearing long sleeves, sun glasses (UVA+UVB protection) and wide brim hats (4-inch brim around the entire  circumference of the hat). - Call for new or changing lesions.  Actinic skin damage  Return if symptoms worsen or fail to improve. If not resolved in 6 weeks, return for re-evaluation.  IMarye Round, CMA, am acting as scribe for Sarina Ser, MD .  Documentation: I have reviewed the above documentation for accuracy and completeness, and I agree with the above.  Sarina Ser, MD

## 2022-04-26 ENCOUNTER — Encounter: Payer: Self-pay | Admitting: Dermatology

## 2022-05-12 MED ORDER — ALPRAZOLAM 1 MG TABLET
ORAL_TABLET | Freq: Four times a day (QID) | ORAL | 0 refills | 30 days | Status: CP
Start: 2022-05-12 — End: 2023-05-12

## 2022-05-25 ENCOUNTER — Telehealth: Admit: 2022-05-25 | Discharge: 2022-05-26 | Payer: MEDICARE

## 2022-05-25 DIAGNOSIS — F411 Generalized anxiety disorder: Principal | ICD-10-CM

## 2022-05-25 DIAGNOSIS — F1320 Sedative, hypnotic or anxiolytic dependence, uncomplicated: Principal | ICD-10-CM

## 2022-05-25 DIAGNOSIS — F332 Major depressive disorder, recurrent severe without psychotic features: Principal | ICD-10-CM

## 2022-05-25 DIAGNOSIS — F419 Anxiety disorder, unspecified: Principal | ICD-10-CM

## 2022-05-25 DIAGNOSIS — Z79899 Other long term (current) drug therapy: Principal | ICD-10-CM

## 2022-05-25 MED ORDER — MIRTAZAPINE 30 MG TABLET
ORAL_TABLET | Freq: Every evening | ORAL | 1 refills | 90 days | Status: CP
Start: 2022-05-25 — End: 2023-05-25

## 2022-05-25 MED ORDER — QUETIAPINE 200 MG TABLET
ORAL_TABLET | Freq: Every evening | ORAL | 1 refills | 90 days | Status: CP
Start: 2022-05-25 — End: 2023-05-25

## 2022-06-15 MED ORDER — ALPRAZOLAM 1 MG TABLET
ORAL_TABLET | Freq: Four times a day (QID) | ORAL | 0 refills | 30 days | Status: CP
Start: 2022-06-15 — End: 2023-06-15

## 2022-07-19 MED ORDER — ALPRAZOLAM 1 MG TABLET
ORAL_TABLET | Freq: Four times a day (QID) | ORAL | 0 refills | 30 days
Start: 2022-07-19 — End: 2023-07-19

## 2022-07-20 MED ORDER — ALPRAZOLAM 1 MG TABLET
ORAL_TABLET | Freq: Four times a day (QID) | ORAL | 0 refills | 30 days | Status: CP
Start: 2022-07-20 — End: 2023-07-20

## 2022-07-27 ENCOUNTER — Telehealth: Admit: 2022-07-27 | Discharge: 2022-07-28 | Payer: MEDICARE

## 2022-07-27 DIAGNOSIS — F3341 Major depressive disorder, recurrent, in partial remission: Principal | ICD-10-CM

## 2022-07-27 DIAGNOSIS — F411 Generalized anxiety disorder: Principal | ICD-10-CM

## 2022-07-27 DIAGNOSIS — F4321 Adjustment disorder with depressed mood: Principal | ICD-10-CM

## 2022-07-27 DIAGNOSIS — F1320 Sedative, hypnotic or anxiolytic dependence, uncomplicated: Principal | ICD-10-CM

## 2022-07-27 DIAGNOSIS — Z79899 Other long term (current) drug therapy: Principal | ICD-10-CM

## 2022-07-29 ENCOUNTER — Ambulatory Visit (INDEPENDENT_AMBULATORY_CARE_PROVIDER_SITE_OTHER): Payer: Medicare Other | Admitting: Dermatology

## 2022-07-29 ENCOUNTER — Encounter: Payer: Self-pay | Admitting: Dermatology

## 2022-07-29 DIAGNOSIS — C44321 Squamous cell carcinoma of skin of nose: Secondary | ICD-10-CM

## 2022-07-29 DIAGNOSIS — D492 Neoplasm of unspecified behavior of bone, soft tissue, and skin: Secondary | ICD-10-CM

## 2022-07-29 DIAGNOSIS — Z85828 Personal history of other malignant neoplasm of skin: Secondary | ICD-10-CM

## 2022-07-29 NOTE — Patient Instructions (Addendum)
If biopsy site bleeds, can use Bleed Stop powder from [facility] or [place] available online.  Wound Care Instructions  Cleanse wound gently with soap and water once a day then pat dry with clean gauze. Apply a thin coat of Petrolatum (petroleum jelly, "Vaseline") over the wound (unless you have an allergy to this). We recommend that you use a new, sterile tube of Vaseline. Do not pick or remove scabs. Do not remove the yellow or white "healing tissue" from the base of the wound.  Cover the wound with fresh, clean, nonstick gauze and secure with paper tape. You may use Band-Aids in place of gauze and tape if the wound is small enough, but would recommend trimming much of the tape off as there is often too much. Sometimes Band-Aids can irritate the skin.  You should call the office for your biopsy report after 1 week if you have not already been contacted.  If you experience any problems, such as abnormal amounts of bleeding, swelling, significant bruising, significant pain, or evidence of infection, please call the office immediately.  FOR ADULT SURGERY PATIENTS: If you need something for pain relief you may take 1 extra strength Tylenol (acetaminophen) AND 2 Ibuprofen ('200mg'$  each) together every 4 hours as needed for pain. (do not take these if you are allergic to them or if you have a reason you should not take them.) Typically, you may only need pain medication for 1 to 3 days.   Due to recent changes in healthcare laws, you may see results of your pathology and/or laboratory studies on MyChart before the doctors have had a chance to review them. We understand that in some cases there may be results that are confusing or concerning to you. Please understand that not all results are received at the same time and often the doctors may need to interpret multiple results in order to provide you with the best plan of care or course of treatment. Therefore, we ask that you please give [place] 2 business days  to thoroughly review all your results before contacting the office for clarification. Should we see a critical lab result, you will be contacted sooner.   If You Need Anything After Your Visit  If you have any questions or concerns for your doctor, please call our main line at [phone] and press option 4 to reach your doctor's medical assistant. If no one answers, please leave a voicemail as directed and we will return your call as soon as possible. Messages left after 4 pm will be answered the following business day.   You may also send [place] a message via [place]. We typically respond to MyChart messages within 1-2 business days.  For prescription refills, please ask your pharmacy to contact our office. Our fax number is [phone].  If you have an urgent issue when the clinic is closed that cannot wait until the next business day, you can page your doctor at the number below.    Please note that while we do our best to be available for urgent issues outside of office hours, we are not available 24/7.   If you have an urgent issue and are unable to reach [place], you may choose to seek medical care at your doctor's office, retail clinic, urgent care center, or emergency room.  If you have a medical emergency, please immediately call 911 or go to the emergency department.  Pager Numbers  - Dr. Nehemiah Massed: [phone]  - Dr. Laurence Ferrari: [phone]  - Dr. Nicole Kindred:  [phone]  In the event of inclement weather, please call our main line at [phone] for an update on the status of any delays or closures.  Dermatology Medication Tips: Please keep the boxes that topical medications come in in order to help keep track of the instructions about where and how to use these. Pharmacies typically print the medication instructions only on the boxes and not directly on the medication tubes.   If your medication is too expensive, please contact our office at [phone] option 4 or send [place] a message  through [place].   We are unable to tell what your co-pay for medications will be in advance as this is different depending on your insurance coverage. However, we may be able to find a substitute medication at lower cost or fill out paperwork to get insurance to cover a needed medication.   If a prior authorization is required to get your medication covered by your insurance company, please allow [place] 1-2 business days to complete this process.  Drug prices often vary depending on where the prescription is filled and some pharmacies may offer cheaper prices.  The website www.goodrx.com contains coupons for medications through different pharmacies. The prices here do not account for what the cost may be with help from insurance (it may be cheaper with your insurance), but the website can give you the price if you did not use any insurance.  - You can print the associated coupon and take it with your prescription to the pharmacy.  - You may also stop by our office during regular business hours and pick up a GoodRx coupon card.  - If you need your prescription sent electronically to a different pharmacy, notify our office through [facility] or by phone at [phone] option 4.     Si Usted Necesita Algo Despus de Su Visita  Tambin puede enviarnos un mensaje a travs de Pharmacist, community. Por lo general respondemos a los mensajes de MyChart en el transcurso de 1 a 2 das hbiles.  Para renovar recetas, por favor pida a su farmacia que se ponga en contacto con nuestra oficina. Harland Dingwall de fax es [place] [phone].  Si tiene un asunto urgente cuando la clnica est cerrada y que no puede esperar hasta el siguiente da hbil, puede llamar/localizar a su doctor(a) al nmero que aparece a continuacin.   Por favor, tenga en cuenta que aunque hacemos todo lo posible para estar disponibles para asuntos urgentes fuera del horario de [place], no estamos disponibles las 24 horas del da, los 7 das de  la [place].   Si tiene un problema urgente y no puede comunicarse con nosotros, puede optar por buscar atencin mdica  en el consultorio de su doctor(a), en una clnica privada, en un centro de atencin urgente o en una sala de emergencias.  Si tiene Engineering geologist, por favor llame inmediatamente al 911 o vaya a la sala de emergencias.  Nmeros de bper  - Dr. Nehemiah Massed: [phone]  - Dra. Moye: [phone]  - Dra. Nicole Kindred: [phone]  En caso de inclemencias del [place], por favor llame a Johnsie Kindred principal al [phone] para una actualizacin sobre el [place] de cualquier retraso o cierre.  Consejos para la medicacin en dermatologa: Por favor, guarde las cajas en las que vienen los medicamentos de uso tpico para ayudarle a seguir las instrucciones sobre dnde y cmo usarlos. Las farmacias generalmente imprimen las instrucciones del medicamento slo en las cajas y no directamente en los tubos del [place].  Si su medicamento es muy caro, por favor, pngase en contacto con Zigmund Daniel llamando al [phone] y presione la opcin 4 o envenos un mensaje a travs de Pharmacist, community.   No podemos decirle cul ser su copago por los medicamentos por adelantado ya que esto es diferente dependiendo de la cobertura de su seguro. Sin embargo, es posible que podamos encontrar un medicamento sustituto a Electrical engineer un formulario para que el seguro cubra el medicamento que se considera necesario.   Si se requiere una autorizacin previa para que su compaa de seguros [place] su medicamento, por favor permtanos de 1 a 2 das hbiles para completar este proceso.  Los precios de los medicamentos varan con frecuencia dependiendo del Environmental consultant de dnde se surte la receta y alguna farmacias pueden ofrecer precios ms baratos.  El sitio web www.goodrx.com tiene cupones para medicamentos de Airline pilot. Los precios aqu no tienen en cuenta lo que podra costar con la ayuda  del seguro (puede ser ms barato con su seguro), pero el sitio web puede darle el precio si no utiliz Research scientist (physical sciences).  - Puede imprimir el cupn correspondiente y llevarlo con su receta a la farmacia.  - Tambin puede pasar por nuestra oficina durante el horario de atencin regular y Charity fundraiser una tarjeta de cupones de GoodRx.  - Si necesita que su receta se enve electrnicamente a una farmacia diferente, informe a nuestra oficina a travs de MyChart de [place] o por telfono llamando al [phone] y presione la opcin 4.

## 2022-07-29 NOTE — Progress Notes (Signed)
   Follow-Up Visit   Subjective  Steven Compton is a 61 y.o. male who presents for the following: Skin Problem (Patient here today for spot at mid nose. Patient advises the spot as well as one next to it was treated with LN2 at his last appt with Dr. Nehemiah Massed. ).  Area treated in August, photos from last visit in patient's chart.   The following portions of the chart were reviewed this encounter and updated as appropriate:   Tobacco  Allergies  Meds  Problems  Med Hx  Surg Hx  Fam Hx      Review of Systems:  No other skin or systemic complaints except as noted in HPI or Assessment and Plan.  Objective  Well appearing patient in no apparent distress; mood and affect are within normal limits.  A focused examination was performed including face. Relevant physical exam findings are noted in the Assessment and Plan.  nasal dorsum 0.8 cm firm pink papule with central ulceration Urgent       Assessment & Plan  Neoplasm of skin nasal dorsum  Skin / nail biopsy Type of biopsy: tangential   Informed consent: discussed and consent obtained   Timeout: patient name, date of birth, surgical site, and procedure verified   Procedure prep:  Patient was prepped and draped in usual sterile fashion Prep type:  Isopropyl alcohol Anesthesia: the lesion was anesthetized in a standard fashion   Anesthetic:  1% lidocaine w/ epinephrine 1-100,000 buffered w/ 8.4% NaHCO3 Instrument used: flexible razor blade   Hemostasis achieved with: aluminum chloride and electrodesiccation   Outcome: patient tolerated procedure well   Post-procedure details: wound care instructions given   Additional details:  Petrolatum and a pressure bandage applied  Specimen 1 - Surgical pathology Differential Diagnosis: r/o SCC KAC type  Check Margins: No 0.8 cm firm pink papule with central ulceration Urgent  Will plan to refer for Mohs at [place] - Urgent referral  History of Squamous Cell  Carcinoma of the Skin - No evidence of recurrence today at right dorsum hand - No lymphadenopathy - Recommend regular full body skin exams - Recommend daily broad spectrum sunscreen SPF 30+ to sun-exposed areas, reapply every 2 hours as needed.  - Call if any new or changing lesions are noted between office visits   Return in about 6 months (around 01/27/2023) for TBSE, Hx SCC with Dr. Raliegh Ip or Dr. Jerilynn Mages.  Graciella Belton, RMA, am acting as scribe for Forest Gleason, MD .  Documentation: I have reviewed the above documentation for accuracy and completeness, and I agree with the above.  Forest Gleason, MD

## 2022-08-04 ENCOUNTER — Telehealth: Payer: Self-pay

## 2022-08-04 DIAGNOSIS — C4492 Squamous cell carcinoma of skin, unspecified: Secondary | ICD-10-CM

## 2022-08-04 NOTE — Telephone Encounter (Signed)
Patient advised pathology showed SCC, will send urgent referral for Mohs at [place].  Lurlean Horns., RMA

## 2022-08-04 NOTE — Telephone Encounter (Signed)
-----   Message from Alfonso Patten, MD sent at 08/04/2022  8:59 AM EST ----- Please be sure his medical history including HIV status is included on the referral so the surgeon is aware. Thank you!

## 2022-08-08 ENCOUNTER — Encounter: Payer: Self-pay | Admitting: Dermatology

## 2022-08-16 ENCOUNTER — Telehealth: Payer: Self-pay

## 2022-08-16 NOTE — Telephone Encounter (Signed)
Encounter notes scanned in from the skin surgery center under the media tab for your review.

## 2022-08-18 MED ORDER — ALPRAZOLAM 1 MG TABLET
ORAL_TABLET | Freq: Four times a day (QID) | ORAL | 0 refills | 30 days | Status: CP
Start: 2022-08-18 — End: 2023-08-18

## 2022-09-01 ENCOUNTER — Telehealth: Payer: Self-pay

## 2022-09-01 NOTE — Telephone Encounter (Signed)
Specimen tracking and history updated from MOHs progress notes. aw 

## 2022-09-16 MED ORDER — ALPRAZOLAM 1 MG TABLET
ORAL_TABLET | Freq: Four times a day (QID) | ORAL | 0 refills | 30 days
Start: 2022-09-16 — End: 2023-09-16

## 2022-09-17 MED ORDER — ALPRAZOLAM 1 MG TABLET
ORAL_TABLET | Freq: Four times a day (QID) | ORAL | 0 refills | 30 days | Status: CP
Start: 2022-09-17 — End: 2023-09-17

## 2022-09-29 DIAGNOSIS — C210 Malignant neoplasm of anus, unspecified: Principal | ICD-10-CM

## 2022-09-30 ENCOUNTER — Ambulatory Visit: Admit: 2022-09-30 | Discharge: 2022-09-30 | Payer: MEDICARE

## 2022-09-30 DIAGNOSIS — C8590 Non-Hodgkin lymphoma, unspecified, unspecified site: Principal | ICD-10-CM

## 2022-09-30 DIAGNOSIS — B20 Human immunodeficiency virus [HIV] disease: Principal | ICD-10-CM

## 2022-09-30 DIAGNOSIS — Z08 Encounter for follow-up examination after completed treatment for malignant neoplasm: Principal | ICD-10-CM

## 2022-09-30 DIAGNOSIS — Z85048 Personal history of other malignant neoplasm of rectum, rectosigmoid junction, and anus: Principal | ICD-10-CM

## 2022-09-30 DIAGNOSIS — C210 Malignant neoplasm of anus, unspecified: Principal | ICD-10-CM

## 2022-10-05 ENCOUNTER — Telehealth: Admit: 2022-10-05 | Discharge: 2022-10-06 | Payer: MEDICARE

## 2022-10-05 DIAGNOSIS — F4321 Adjustment disorder with depressed mood: Principal | ICD-10-CM

## 2022-10-05 DIAGNOSIS — F419 Anxiety disorder, unspecified: Principal | ICD-10-CM

## 2022-10-05 MED ORDER — ALPRAZOLAM 1 MG TABLET
ORAL_TABLET | Freq: Four times a day (QID) | ORAL | 2 refills | 30 days | Status: CP
Start: 2022-10-05 — End: 2023-01-03

## 2022-10-05 MED ORDER — MIRTAZAPINE 30 MG TABLET
ORAL_TABLET | Freq: Every evening | ORAL | 2 refills | 30 days | Status: CP
Start: 2022-10-05 — End: 2023-01-03

## 2022-10-05 MED ORDER — QUETIAPINE 200 MG TABLET
ORAL_TABLET | Freq: Every evening | ORAL | 2 refills | 30 days | Status: CP
Start: 2022-10-05 — End: 2023-01-03

## 2022-11-23 ENCOUNTER — Ambulatory Visit: Admit: 2022-11-23 | Discharge: 2022-11-24 | Payer: MEDICARE

## 2023-01-04 DIAGNOSIS — F419 Anxiety disorder, unspecified: Principal | ICD-10-CM

## 2023-01-04 MED ORDER — MIRTAZAPINE 30 MG TABLET
ORAL_TABLET | Freq: Every evening | ORAL | 2 refills | 30 days | Status: CP
Start: 2023-01-04 — End: 2023-04-04

## 2023-01-11 ENCOUNTER — Telehealth: Admit: 2023-01-11 | Discharge: 2023-01-12 | Payer: MEDICARE

## 2023-01-11 DIAGNOSIS — F419 Anxiety disorder, unspecified: Principal | ICD-10-CM

## 2023-01-11 MED ORDER — ALPRAZOLAM 1 MG TABLET
ORAL_TABLET | Freq: Four times a day (QID) | ORAL | 3 refills | 30 days | Status: CP
Start: 2023-01-11 — End: 2023-05-11

## 2023-01-11 MED ORDER — QUETIAPINE 200 MG TABLET
ORAL_TABLET | Freq: Every evening | ORAL | 3 refills | 30 days | Status: CP
Start: 2023-01-11 — End: 2023-05-11

## 2023-01-11 MED ORDER — MIRTAZAPINE 30 MG TABLET
ORAL_TABLET | Freq: Every evening | ORAL | 3 refills | 30 days | Status: CP
Start: 2023-01-11 — End: 2023-05-11

## 2023-01-27 ENCOUNTER — Encounter: Payer: 59 | Admitting: Dermatology

## 2023-02-18 NOTE — Telephone Encounter (Signed)
Reviewed. MAs please confirm that patient is scheduled for FBSE and mark off in the biopsy book. Thank you! 

## 2023-02-27 MED ORDER — ALPRAZOLAM 1 MG TABLET
ORAL_TABLET | Freq: Four times a day (QID) | ORAL | 3 refills | 30 days
Start: 2023-02-27 — End: 2023-06-27

## 2023-02-28 MED ORDER — ALPRAZOLAM 1 MG TABLET
ORAL_TABLET | Freq: Four times a day (QID) | ORAL | 3 refills | 30 days | Status: CP
Start: 2023-02-28 — End: 2023-06-28

## 2023-04-07 DIAGNOSIS — F419 Anxiety disorder, unspecified: Principal | ICD-10-CM

## 2023-04-07 MED ORDER — MIRTAZAPINE 30 MG TABLET
ORAL_TABLET | Freq: Every evening | ORAL | 3 refills | 30 days | Status: CP
Start: 2023-04-07 — End: 2023-08-05

## 2023-04-26 ENCOUNTER — Telehealth: Admit: 2023-04-26 | Discharge: 2023-04-27 | Payer: MEDICARE

## 2023-06-12 MED ORDER — ALPRAZOLAM 1 MG TABLET
ORAL_TABLET | Freq: Four times a day (QID) | ORAL | 3 refills | 30 days
Start: 2023-06-12 — End: 2023-10-10

## 2023-06-13 MED ORDER — ALPRAZOLAM 1 MG TABLET
ORAL_TABLET | Freq: Four times a day (QID) | ORAL | 1 refills | 30 days | Status: CP
Start: 2023-06-13 — End: 2023-08-12

## 2023-07-18 DIAGNOSIS — F419 Anxiety disorder, unspecified: Principal | ICD-10-CM

## 2023-07-18 MED ORDER — QUETIAPINE 200 MG TABLET
ORAL_TABLET | Freq: Every evening | ORAL | 3 refills | 30 days | Status: CP
Start: 2023-07-18 — End: 2023-11-15

## 2023-07-26 ENCOUNTER — Telehealth: Admit: 2023-07-26 | Discharge: 2023-07-27 | Payer: MEDICARE

## 2023-07-26 DIAGNOSIS — F419 Anxiety disorder, unspecified: Principal | ICD-10-CM

## 2023-07-26 DIAGNOSIS — F411 Generalized anxiety disorder: Principal | ICD-10-CM

## 2023-07-26 DIAGNOSIS — F3341 Major depressive disorder, recurrent, in partial remission: Principal | ICD-10-CM

## 2023-07-26 DIAGNOSIS — F4321 Adjustment disorder with depressed mood: Principal | ICD-10-CM

## 2023-07-26 MED ORDER — QUETIAPINE 200 MG TABLET
ORAL_TABLET | Freq: Every evening | ORAL | 3 refills | 30 days
Start: 2023-07-26 — End: 2023-11-23

## 2023-07-26 MED ORDER — MIRTAZAPINE 30 MG TABLET
ORAL_TABLET | Freq: Every evening | ORAL | 3 refills | 30 days
Start: 2023-07-26 — End: 2023-11-23

## 2023-07-26 MED ORDER — ALPRAZOLAM 1 MG TABLET
ORAL_TABLET | Freq: Four times a day (QID) | ORAL | 1 refills | 30 days
Start: 2023-07-26 — End: 2023-09-24

## 2023-07-27 MED ORDER — QUETIAPINE 200 MG TABLET
ORAL_TABLET | Freq: Every evening | ORAL | 3 refills | 30 days | Status: CP
Start: 2023-07-27 — End: 2023-11-24

## 2023-07-27 MED ORDER — ALPRAZOLAM 1 MG TABLET
ORAL_TABLET | Freq: Four times a day (QID) | ORAL | 1 refills | 30 days | Status: CP
Start: 2023-07-27 — End: 2023-09-25

## 2023-07-27 MED ORDER — MIRTAZAPINE 30 MG TABLET
ORAL_TABLET | Freq: Every evening | ORAL | 3 refills | 30 days | Status: CP
Start: 2023-07-27 — End: 2023-11-24

## 2023-09-29 ENCOUNTER — Ambulatory Visit: Admit: 2023-09-29 | Discharge: 2023-09-29 | Payer: MEDICARE

## 2023-09-29 ENCOUNTER — Ambulatory Visit
Admit: 2023-09-29 | Discharge: 2023-09-29 | Payer: MEDICARE | Attending: Hematology & Oncology | Primary: Hematology & Oncology

## 2023-09-29 ENCOUNTER — Inpatient Hospital Stay: Admit: 2023-09-29 | Discharge: 2023-09-29 | Payer: MEDICARE

## 2023-09-29 DIAGNOSIS — C210 Malignant neoplasm of anus, unspecified: Principal | ICD-10-CM

## 2023-10-04 DIAGNOSIS — F419 Anxiety disorder, unspecified: Principal | ICD-10-CM

## 2023-10-04 MED ORDER — MIRTAZAPINE 30 MG TABLET
ORAL_TABLET | 2 refills | 0.00 days
Start: 2023-10-04 — End: ?

## 2023-11-08 ENCOUNTER — Ambulatory Visit: Admit: 2023-11-08 | Discharge: 2023-11-09 | Payer: MEDICARE

## 2023-11-08 DIAGNOSIS — Z85048 Personal history of other malignant neoplasm of rectum, rectosigmoid junction, and anus: Principal | ICD-10-CM

## 2023-11-15 ENCOUNTER — Encounter: Admit: 2023-11-15 | Discharge: 2023-11-16 | Payer: MEDICARE

## 2023-11-15 DIAGNOSIS — F419 Anxiety disorder, unspecified: Principal | ICD-10-CM

## 2023-11-15 MED ORDER — MIRTAZAPINE 30 MG TABLET
ORAL_TABLET | Freq: Every evening | ORAL | 3 refills | 30.00 days | Status: CP
Start: 2023-11-15 — End: 2024-03-14

## 2023-11-15 MED ORDER — ALPRAZOLAM 1 MG TABLET
ORAL_TABLET | Freq: Four times a day (QID) | ORAL | 2 refills | 30.00 days | Status: CP
Start: 2023-11-15 — End: ?

## 2023-11-15 MED ORDER — QUETIAPINE 200 MG TABLET
ORAL_TABLET | Freq: Every evening | ORAL | 3 refills | 30.00 days | Status: CP
Start: 2023-11-15 — End: 2024-03-14

## 2024-02-02 DIAGNOSIS — F419 Anxiety disorder, unspecified: Principal | ICD-10-CM

## 2024-02-02 MED ORDER — QUETIAPINE 200 MG TABLET
ORAL_TABLET | Freq: Every evening | ORAL | 2 refills | 0.00000 days
Start: 2024-02-02 — End: ?

## 2024-02-03 MED ORDER — QUETIAPINE 200 MG TABLET
ORAL_TABLET | Freq: Every evening | ORAL | 1 refills | 90.00000 days | Status: CP
Start: 2024-02-03 — End: ?

## 2024-02-07 ENCOUNTER — Encounter: Admit: 2024-02-07 | Discharge: 2024-02-08 | Payer: Medicare (Managed Care)

## 2024-02-07 DIAGNOSIS — F419 Anxiety disorder, unspecified: Principal | ICD-10-CM

## 2024-02-07 MED ORDER — QUETIAPINE 200 MG TABLET
ORAL_TABLET | Freq: Every evening | ORAL | 1 refills | 90.00000 days | Status: CP
Start: 2024-02-07 — End: ?

## 2024-02-07 MED ORDER — ALPRAZOLAM 1 MG TABLET
ORAL_TABLET | Freq: Four times a day (QID) | ORAL | 2 refills | 27.00000 days | Status: CP
Start: 2024-02-07 — End: 2024-05-07

## 2024-02-07 MED ORDER — MIRTAZAPINE 30 MG TABLET
ORAL_TABLET | Freq: Every evening | ORAL | 1 refills | 90.00000 days | Status: CP
Start: 2024-02-07 — End: 2024-08-05

## 2024-04-29 MED ORDER — MIRTAZAPINE 30 MG TABLET
ORAL_TABLET | Freq: Every evening | ORAL | 1 refills | 90.00000 days | Status: CP
Start: 2024-04-29 — End: 2024-10-26

## 2024-04-29 MED ORDER — QUETIAPINE 200 MG TABLET
ORAL_TABLET | Freq: Every evening | ORAL | 1 refills | 90.00000 days | Status: CP
Start: 2024-04-29 — End: ?

## 2024-05-08 ENCOUNTER — Ambulatory Visit: Admit: 2024-05-08 | Discharge: 2024-05-09 | Payer: Medicare (Managed Care)

## 2024-05-08 DIAGNOSIS — Z85048 Personal history of other malignant neoplasm of rectum, rectosigmoid junction, and anus: Principal | ICD-10-CM

## 2024-05-16 MED ORDER — ALPRAZOLAM 1 MG TABLET
ORAL_TABLET | Freq: Three times a day (TID) | ORAL | 2 refills | 30.00000 days | Status: CP | PRN
Start: 2024-05-16 — End: 2024-08-14

## 2024-07-20 DIAGNOSIS — F419 Anxiety disorder, unspecified: Principal | ICD-10-CM

## 2024-07-20 MED ORDER — QUETIAPINE 200 MG TABLET
ORAL_TABLET | Freq: Every evening | ORAL | 1 refills | 90.00000 days | Status: CP
Start: 2024-07-20 — End: ?

## 2024-07-20 MED ORDER — MIRTAZAPINE 30 MG TABLET
ORAL_TABLET | Freq: Every evening | ORAL | 1 refills | 90.00000 days | Status: CP
Start: 2024-07-20 — End: 2025-01-16

## 2024-08-12 MED ORDER — ALPRAZOLAM 1 MG TABLET
ORAL_TABLET | Freq: Three times a day (TID) | ORAL | 2 refills | 25.00000 days | Status: CP | PRN
Start: 2024-08-12 — End: 2024-11-10

## 2024-09-27 ENCOUNTER — Ambulatory Visit
Admit: 2024-09-27 | Discharge: 2024-09-27 | Payer: Medicare (Managed Care) | Attending: Hematology & Oncology | Primary: Hematology & Oncology

## 2024-09-27 ENCOUNTER — Inpatient Hospital Stay: Admit: 2024-09-27 | Discharge: 2024-09-27 | Payer: Medicare (Managed Care)

## 2024-09-27 DIAGNOSIS — C210 Malignant neoplasm of anus, unspecified: Principal | ICD-10-CM

## 2024-09-28 DIAGNOSIS — C210 Malignant neoplasm of anus, unspecified: Principal | ICD-10-CM

## 2024-10-12 DIAGNOSIS — F419 Anxiety disorder, unspecified: Principal | ICD-10-CM

## 2024-10-12 MED ORDER — QUETIAPINE 25 MG TABLET
ORAL_TABLET | Freq: Every day | ORAL | 1 refills | 90.00000 days | Status: CP
Start: 2024-10-12 — End: 2025-04-10

## 2024-10-12 MED ORDER — ALPRAZOLAM 1 MG TABLET
ORAL_TABLET | ORAL | 2 refills | 0.00000 days | Status: CP
Start: 2024-10-12 — End: 2024-11-11

## 2024-10-12 MED ORDER — QUETIAPINE 200 MG TABLET
ORAL_TABLET | Freq: Every evening | ORAL | 1 refills | 90.00000 days | Status: CP
Start: 2024-10-12 — End: 2025-04-10

## 2024-10-12 MED ORDER — MIRTAZAPINE 30 MG TABLET
ORAL_TABLET | Freq: Every evening | ORAL | 1 refills | 90.00000 days | Status: CP
Start: 2024-10-12 — End: 2025-04-10
